# Patient Record
Sex: Female | Born: 1955 | Race: White | Hispanic: No | Marital: Married | Smoking: Current every day smoker
Health system: Southern US, Community
[De-identification: ages and names within clinical notes are randomized; demographics above are authoritative.]

## PROBLEM LIST (undated history)

## (undated) DIAGNOSIS — F32A Depression, unspecified: Secondary | ICD-10-CM

## (undated) DIAGNOSIS — K219 Gastro-esophageal reflux disease without esophagitis: Secondary | ICD-10-CM

## (undated) DIAGNOSIS — F419 Anxiety disorder, unspecified: Secondary | ICD-10-CM

## (undated) DIAGNOSIS — C801 Malignant (primary) neoplasm, unspecified: Secondary | ICD-10-CM

## (undated) DIAGNOSIS — F329 Major depressive disorder, single episode, unspecified: Secondary | ICD-10-CM

## (undated) DIAGNOSIS — E785 Hyperlipidemia, unspecified: Secondary | ICD-10-CM

## (undated) HISTORY — DX: Anxiety disorder, unspecified: F41.9

## (undated) HISTORY — DX: Gastro-esophageal reflux disease without esophagitis: K21.9

## (undated) HISTORY — PX: APPENDECTOMY: SHX54

## (undated) HISTORY — DX: Malignant (primary) neoplasm, unspecified: C80.1

## (undated) HISTORY — PX: ABDOMINAL HYSTERECTOMY: SHX81

## (undated) HISTORY — PX: POLYPECTOMY: SHX149

## (undated) HISTORY — DX: Depression, unspecified: F32.A

## (undated) HISTORY — PX: BACK SURGERY: SHX140

## (undated) HISTORY — DX: Major depressive disorder, single episode, unspecified: F32.9

## (undated) HISTORY — DX: Hyperlipidemia, unspecified: E78.5

## (undated) HISTORY — PX: COLONOSCOPY: SHX174

---

## 2000-07-01 ENCOUNTER — Encounter: Admission: RE | Admit: 2000-07-01 | Discharge: 2000-07-01 | Payer: Self-pay | Admitting: Obstetrics and Gynecology

## 2000-07-01 ENCOUNTER — Encounter: Payer: Self-pay | Admitting: Obstetrics and Gynecology

## 2001-11-24 ENCOUNTER — Encounter: Admission: RE | Admit: 2001-11-24 | Discharge: 2001-11-24 | Payer: Self-pay | Admitting: Obstetrics and Gynecology

## 2001-11-24 ENCOUNTER — Encounter: Payer: Self-pay | Admitting: Obstetrics and Gynecology

## 2003-01-02 ENCOUNTER — Observation Stay (HOSPITAL_COMMUNITY): Admission: AD | Admit: 2003-01-02 | Discharge: 2003-01-03 | Payer: Self-pay | Admitting: Physical Therapy

## 2003-01-02 ENCOUNTER — Encounter: Payer: Self-pay | Admitting: Internal Medicine

## 2003-01-03 ENCOUNTER — Encounter (INDEPENDENT_AMBULATORY_CARE_PROVIDER_SITE_OTHER): Payer: Self-pay | Admitting: Specialist

## 2003-01-03 ENCOUNTER — Encounter: Payer: Self-pay | Admitting: Obstetrics and Gynecology

## 2003-01-03 ENCOUNTER — Encounter (INDEPENDENT_AMBULATORY_CARE_PROVIDER_SITE_OTHER): Payer: Self-pay

## 2003-05-02 ENCOUNTER — Other Ambulatory Visit: Admission: RE | Admit: 2003-05-02 | Discharge: 2003-05-02 | Payer: Self-pay | Admitting: Obstetrics and Gynecology

## 2003-08-09 ENCOUNTER — Ambulatory Visit (HOSPITAL_COMMUNITY): Admission: RE | Admit: 2003-08-09 | Discharge: 2003-08-09 | Payer: Self-pay | Admitting: Neurosurgery

## 2004-06-03 ENCOUNTER — Other Ambulatory Visit: Admission: RE | Admit: 2004-06-03 | Discharge: 2004-06-03 | Payer: Self-pay | Admitting: Obstetrics and Gynecology

## 2005-02-26 ENCOUNTER — Ambulatory Visit: Payer: Self-pay | Admitting: Psychiatry

## 2005-02-26 ENCOUNTER — Emergency Department (HOSPITAL_COMMUNITY): Admission: EM | Admit: 2005-02-26 | Discharge: 2005-02-26 | Payer: Self-pay | Admitting: Emergency Medicine

## 2005-02-26 ENCOUNTER — Inpatient Hospital Stay (HOSPITAL_COMMUNITY): Admission: RE | Admit: 2005-02-26 | Discharge: 2005-03-02 | Payer: Self-pay | Admitting: Psychiatry

## 2005-03-09 ENCOUNTER — Ambulatory Visit (HOSPITAL_COMMUNITY): Payer: Self-pay | Admitting: Psychiatry

## 2005-03-23 ENCOUNTER — Ambulatory Visit (HOSPITAL_COMMUNITY): Payer: Self-pay | Admitting: Psychiatry

## 2005-04-20 ENCOUNTER — Ambulatory Visit (HOSPITAL_COMMUNITY): Payer: Self-pay | Admitting: Psychiatry

## 2005-05-22 ENCOUNTER — Ambulatory Visit (HOSPITAL_COMMUNITY): Admission: RE | Admit: 2005-05-22 | Discharge: 2005-05-22 | Payer: Self-pay | Admitting: Neurosurgery

## 2005-07-10 ENCOUNTER — Ambulatory Visit (HOSPITAL_COMMUNITY): Admission: RE | Admit: 2005-07-10 | Discharge: 2005-07-11 | Payer: Self-pay | Admitting: Neurosurgery

## 2005-08-19 ENCOUNTER — Ambulatory Visit (HOSPITAL_COMMUNITY): Payer: Self-pay | Admitting: Psychiatry

## 2005-08-20 ENCOUNTER — Other Ambulatory Visit: Admission: RE | Admit: 2005-08-20 | Discharge: 2005-08-20 | Payer: Self-pay | Admitting: Obstetrics and Gynecology

## 2005-12-16 ENCOUNTER — Ambulatory Visit (HOSPITAL_COMMUNITY): Payer: Self-pay | Admitting: Psychiatry

## 2006-01-14 ENCOUNTER — Ambulatory Visit (HOSPITAL_COMMUNITY): Payer: Self-pay | Admitting: Psychiatry

## 2006-02-22 ENCOUNTER — Ambulatory Visit (HOSPITAL_COMMUNITY): Payer: Self-pay | Admitting: Licensed Clinical Social Worker

## 2006-02-23 ENCOUNTER — Ambulatory Visit (HOSPITAL_COMMUNITY): Payer: Self-pay | Admitting: Psychiatry

## 2006-03-09 ENCOUNTER — Ambulatory Visit (HOSPITAL_COMMUNITY): Payer: Self-pay | Admitting: Licensed Clinical Social Worker

## 2006-03-15 ENCOUNTER — Ambulatory Visit (HOSPITAL_COMMUNITY): Payer: Self-pay | Admitting: Licensed Clinical Social Worker

## 2006-03-22 ENCOUNTER — Ambulatory Visit (HOSPITAL_COMMUNITY): Payer: Self-pay | Admitting: Licensed Clinical Social Worker

## 2006-03-29 ENCOUNTER — Ambulatory Visit (HOSPITAL_COMMUNITY): Payer: Self-pay | Admitting: Licensed Clinical Social Worker

## 2006-03-30 ENCOUNTER — Ambulatory Visit (HOSPITAL_COMMUNITY): Payer: Self-pay | Admitting: Psychiatry

## 2006-04-07 ENCOUNTER — Ambulatory Visit (HOSPITAL_COMMUNITY): Payer: Self-pay | Admitting: Licensed Clinical Social Worker

## 2006-04-27 ENCOUNTER — Ambulatory Visit (HOSPITAL_COMMUNITY): Payer: Self-pay | Admitting: Psychiatry

## 2006-06-22 ENCOUNTER — Ambulatory Visit (HOSPITAL_COMMUNITY): Payer: Self-pay | Admitting: Psychiatry

## 2006-09-28 ENCOUNTER — Ambulatory Visit (HOSPITAL_COMMUNITY): Payer: Self-pay | Admitting: Psychiatry

## 2006-12-03 ENCOUNTER — Encounter: Admission: RE | Admit: 2006-12-03 | Discharge: 2006-12-03 | Payer: Self-pay | Admitting: Obstetrics and Gynecology

## 2006-12-19 ENCOUNTER — Encounter: Admission: RE | Admit: 2006-12-19 | Discharge: 2006-12-19 | Payer: Self-pay | Admitting: Physical Therapy

## 2007-09-06 ENCOUNTER — Encounter: Admission: RE | Admit: 2007-09-06 | Discharge: 2007-09-06 | Payer: Self-pay | Admitting: Neurosurgery

## 2008-09-12 ENCOUNTER — Ambulatory Visit (HOSPITAL_COMMUNITY): Payer: Self-pay | Admitting: Psychiatry

## 2010-04-15 ENCOUNTER — Ambulatory Visit (HOSPITAL_COMMUNITY): Payer: Self-pay | Admitting: Psychiatry

## 2010-11-21 ENCOUNTER — Encounter (HOSPITAL_COMMUNITY): Payer: Self-pay | Admitting: Physician Assistant

## 2010-12-19 NOTE — H&P (Signed)
NAME:  BUENA, BOEHM                            ACCOUNT NO.:  [id number]   MEDICAL RECORD NO.:  [id number]                   PATIENT TYPE:  OBV   LOCATION:  9324                                 FACILITY:  [facility]   PHYSICIAN:  Dineen Kid. Rana Snare, M.D.                 DATE OF BIRTH:  [birth year]   DATE OF ADMISSION:  01/02/2003  DATE OF DISCHARGE:                                HISTORY & PHYSICAL   HISTORY OF PRESENT ILLNESS:  Ms. Pratt is a 55 year old, G1, P0, A1 who  presented to Dr. Merla Riches today with subacute onset of left lower quadrant  pain. Began two to three days ago when she woke in the morning and went to  church. Got somewhat better later that day, but progressively it has gotten  worse. She did have some relief after having a bowel movement yesterday, but  today because it continued to get worse, she presented to urgent care for  further evaluation. She denies any fevers, chills, nausea, vomiting. Her  past history is significant for history of carcinoma in situ of the cervix  where she had a radical hysterectomy at age 72, but both ovaries and tubes  were left at that time. Dr. Merla Riches examined her and thought she had an  acute abdomen with diverticulitis, sent her over to [facility] for a CAT  scan with contrast. The report of the CT was that she has a left complex  mass measuring 6.1 x 5.8 cm in the left adnexa with the differential  including tubo-ovarian abscess, cystic ovarian neoplasm, ovarian cyst with  possible hemorrhage or infection. The patient has remained afebrile, but  rates her pain on a scale of 10 at 5/10 but gets great relief from one half  of the Darvocet. She had an elevated white count of 15.4 today with a  hemoglobin of 12.6. Also had a negative chest x-ray and again has remained  afebrile.   PAST MEDICAL HISTORY:  Significant for general anxiety disorder on Prozac  and reflux disease. History of diverticulitis in the past.   PAST SURGICAL HISTORY:  1. She had a hysterectomy for carcinoma in situ in 1988 removing lymph nodes     and the uterus.  2. She had a lumbar back surgery in 1998.  3. Appendectomy in 1967.   GYNECOLOGICAL HISTORY:  She has had her GYN care by Dr. ________ in [facility]. Desires evaluation here. Pap smears have been normal. She also had a  normal SCC which has been drawn for eight years after her hysterectomy which  has remained normal. She also had a CA125 last year.   MEDICATIONS:  1. Prozac 20 mg a day.  2. Pepcid A.C. as needed.  3. Tylenol P.M. as needed.  4. Multivitamin q.d.  5. Darvocet one half tablet as needed.   SOCIAL HISTORY:  She is a half pack  per day smoker and social drinker.   PHYSICAL EXAMINATION:  VITAL SIGNS:  Her blood pressure is 115/68,  respirations 18, pulse 72, temperature 97.7.  GENERAL:  We have a well-nourished white female in no apparent distress,  speaking comfortably in a normal voice.  HEART:  Regular rate and rhythm.  LUNGS:  Clear to auscultation bilaterally.  ABDOMEN:  Bilateral upper quadrants are soft and nontender, is nondistended.  She does have some guarding in bilateral lower quadrants, left greater than  the right. No appreciable rebound is noted. Normal active bowel sounds.  PELVIC:  Exam was deferred. No flank pain noted.   IMPRESSION AND PLAN:  Left lower quadrant pain, left adnexal complex mass,  most likely represents ovarian cyst which is being complicated by hemorrhage  and possible infection due to the mildly elevated white count and increased  abdominal tenderness. Go ahead and place her on cefotetan 1 g q.12h. Keep  her on OP status with IV fluids. She has tolerated a regular diet tonight  without difficulty. Give her pain relief with Darvocet which is very  successful with low dose of Darvocet. Plan to repeat the CBC in the morning.  Will also repeat the CA-125. Will try to request records from Dr. ________  regarding the previous surgery and  cervical cancer and labs for that, and we  perform an ultrasound tomorrow morning of the pelvis for further evaluation.  Discussed possible options including laparoscopy or laparotomy. If the  patient continues do as well as she is today and the ultrasound has benign  appearance, we will discharge her home with follow up as an outpatient.                                               Dineen Kid Rana Snare, M.D.    DCL/MEDQ  D:  01/03/2003  T:  01/03/2003  Job:  [id number]

## 2010-12-19 NOTE — Op Note (Signed)
NAME:  Kayla [name], Kayla [name]                            ACCOUNT NO.:  [id number]   MEDICAL RECORD NO.:  [id number]                   PATIENT TYPE:  OBV   LOCATION:  9324                                 FACILITY:  [facility]   PHYSICIAN:  Dineen Kid. Rana Snare, M.D.                 DATE OF BIRTH:  [birth year]   DATE OF PROCEDURE:  01/03/2003  DATE OF DISCHARGE:                                 OPERATIVE REPORT   PREOPERATIVE DIAGNOSIS:  Left lower quadrant pain and left complex mass.   POSTOPERATIVE DIAGNOSIS:  Left lower quadrant pain and left complex mass,  hemorrhagic corpus luteum cyst and serous cyst and extensive pelvic and  abdominal adhesions.   PROCEDURE:  Laparoscopy with extensive lysis of adhesions and left ovarian  cystectomy.   SURGEON:  Dineen Kid. Rana Snare, M.D.   ANESTHESIA:  General endotracheal.   ESTIMATED BLOOD LOSS:  Less than 25 cc.   INDICATIONS FOR PROCEDURE:  Ms. Minner is a 78- year-old G1,P0 who presented  to the emergency room last night with left lower quadrant pain worsening  over the last 3-4 days, elevated white count, some rebound and guarding.  CT  scan and ultrasound showed a left ovarian complex mass.  The patient has a  remote history of radical hysterectomy for cervical cancer.  Because of  persistent pain not improving with conservative management and a complex  mass of the left adnexa with concern about torsion or ovarian neoplasm,  patient desires definitive surgical evaluation and treatment.  Plan  laparoscopy for evaluation, possible ovarian cystectomy, possible left  salpingo-oophorectomy, possible bilateral salpingo-oophorectomy and/or  indicated procedures.  The patient does give her Informed Consent.  The  risks and benefits were discussed at length which include, but are not  limited to, risk of infection, bleeding, damage to bowel, bladder, ureters,  risks associated with blood transfusion if there is hemorrhage, risk of  recurrence of pain or need for further  surgery, or risk of needing to open  her for further evaluation and treatment.  She does give her Informed  Consent.   OPERATIVE FINDINGS:  Extensive anterior abdominal wall adhesions from the  omentum and bowel.  Extensive pelvic adhesions in the left adnexa with the  ovary, fallopian tube, underneath the ovary, the bladder on top of the left  ovarian complex, and bowel and omentum over that as well.  A large serous  appearing cyst with an underlying cyst, what appeared to be hemorrhagic  corpus luteum in nature.  The right ovary and tube appear to be grossly  normal in appearance however adhered to the pelvic sidewall and to bowel.   DESCRIPTION OF PROCEDURE:  After adequate analgesia, the patient was placed  in the dorsal lithotomy position.  She was sterilely prepped and draped.  The bladder was sterilely drained.  A sponge stick was placed in the vagina.  A 1 cm  infraumbilical skin incision was made, a Veress needle was inserted.  The abdomen was insufflated with dullness to percussion.  A trocar was  inserted and was thought to be extraperitoneal due to the findings with the  laparoscope.  It was reinserted and finally peritoneum was grasped and  delivered the trocar through the peritoneum.  It was then realized that  because of the extensive adhesions it was not extraperitoneal but actually  adhesions around the incision site that we unable to see through.  After  examination of that area of insertion and no evidence of trauma to bowel or  bleeding, small holes were made through areas of adhesions so that the  laparoscope could be inserted.  This was performed until the laparoscope was  into the pelvis where the above findings were noted.  The confirmation of  the lysis of adhesions through the bowel and assurance that there was no  injury to the bowel or bleeding was performed by putting a 5 mm trocar into  the right lower quadrant two finger breadths above the pubic symphysis  under  direct visualization and  a 5 mm camera was inserted and the examination was  carried out with this.  At this point the regular laparoscope was  reinserted.  The ovarian cyst was grasped and cauterized with bipolar  cautery.  A large amount of serous fluid was relieved from a large cystic  area.  The cyst wall was sharply excised and sent to Pathology.  Underneath  this cystic area below it was opened had several centimeters worth of clot  consistent with a hemorrhagic corpus luteum.  This was also sent to  Pathology.  The majority of the cyst wall and ovary that we identified and  carefully removed was cauterized and removed.  Because of extensive  adhesions to the bowel, bladder and pelvic sidewall, it was felt that at  this time laparoscopically no further removal of the ovary could be safely  carried out.  Similarly with the right tube and ovary because they appeared  to be benign in appearance and densely adherent to the bowel, it was felt  that it would be best to leave them at this time.  If future removal of this  was necessary due to ongoing pain or recurrence of this, then we felt that a  laparotomy with general surgeon availability for bowel and possibly a  urologist for identification of the ureters would be necessary at the time.  So after careful dissection of the ovarian cyst out and sent to Pathology,  it returned as benign-appearing corpus luteal cyst and clot, hemostasis was  achieved with bipolar cautery.  A copious amount of irrigation was used and  at this point the trocars were removed and noted to be hemostatic and the  laparoscope was removed.  The skin incisions closed with a 0Vicryl, the  fascia a 3-0 Vicryl Rapide subcuticular stitch.  The 5 mm site was closed  with 3-0 Vicryl Rapide subcuticular stitch and incisions were injected with  0.25% Marcaine.  The sponge stick was removed from the vagina and the patient was transferred to the recovery room in  stable condition.   DISPOSITION:  The patient will be observed the remainder of this evening.  If she continues to do well as I would expect then we will discharge her  home with follow up in two to three weeks.  She received 1 gm of Cefotetan  preoperatively.  Dineen Kid Rana Snare, M.D.    DCL/MEDQ  D:  01/03/2003  T:  01/03/2003  Job:  [id number]

## 2010-12-19 NOTE — Op Note (Signed)
NAME:  Kayla [name], Kayla [name]                            ACCOUNT NO.:  [id number]   MEDICAL RECORD NO.:  [id number]                   PATIENT TYPE:  OIB   LOCATION:  3172                                 FACILITY:  [facility]   PHYSICIAN:  Danae Orleans. Venetia Maxon, M.D.               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   OPERATION PERFORMED:  Left L4-5 microdiskectomy and microdissection.   SURGEON:  Danae Orleans. Venetia Maxon, M.D.   ASSISTANT:  Clydene Fake, M.D.   ANESTHESIA:  General endotracheal.   ESTIMATED BLOOD LOSS:  Minimal.   COMPLICATIONS:  None.   DISPOSITION:  Recovery.   INDICATIONS FOR PROCEDURE:  [name] is a 56 year old woman with a  herniated lumbar disk at L4-5 on the left with significant left L5  radiculopathy.  It was elected to take her to surgery for microdiskectomy.   DESCRIPTION OF PROCEDURE:  [name] was brought to the operating room.  Following the satisfactory and uncomplicated induction of general  endotracheal anesthesia and placement of intravenous lines, the patient was  placed in a prone position on the Wilson frame.  The low back was then  prepped and draped in the usual sterile fashion.  The area of planned  incision was infiltrated with 0.25% Marcaine, 0.5% lidocaine, 1:200,000  epinephrine.  Incision was made in the midline and carried through adipose  tissue to the lumbodorsal fascia which was incised on the left side of  midline.  Subperiosteal dissection was then performed exposing the L4-5  interspace and intraoperative x-ray confirmed correct level.  A self-  retaining retractor was placed to facilitate  exposure.  Hemisemilaminectomy  of L4 was then performed as well as the foraminotomy overlying the superior  aspect of L5 with a high speed drill and Kerrison rongeurs.  The ligamentum  flavum was removed and detached in a piecemeal fashion and the lateral  recess was decompressed.  Microscope was brought into the field and using  microdissection technique, the L5 nerve root was mobilized medially exposing  the large herniated disk thinly contained by annular fibers.  This was then  incised with a 15 blade and disk material was removed in piecemeal fashion.  Medial and lateral aspect of the interspace was decompressed of residual  disk material.  Redundant annular edges were removed with osteophyte tool.  Hemostasis was assured.  The wound was copiously irrigated with bacitracin  saline.  A coronary dilator was easily passed along the floor of the canal  and there did not appear to be any obstruction of the nerve roots or thecal  sac either on L4 or 5 nerve roots.  The self-retaining retractor was  removed.  The microscope was taken out of the field.  The lumbodorsal fascia  was closed with 0 Vicryl suture.  The subcutaneous tissue were  reapproximated with 2-0 Vicryl interrupted inverted sutures and the skin  edges were reapproximated with interrupted 3-0 Vicryl subcuticular stitch.  The wound was dressed with Dermabond.  The patient was extubated in the  operating room and taken to the recovery room in stable and satisfactory  condition having tolerated the operation well.  Counts were correct at the  end of the case.                                               Danae Orleans. Venetia Maxon, M.D.    JDS/MEDQ  D:  08/09/2003  T:  08/09/2003  Job:  [id number]

## 2010-12-19 NOTE — Discharge Summary (Signed)
NAMEAMIRA, PODOLAK NO.:  [id number]   MEDICAL RECORD NO.:  [id number]          PATIENT TYPE:  IPS   LOCATION:  0302                          FACILITY:  [facility]   PHYSICIAN:  Anselm Jungling, MD  DATE OF BIRTH:  08-09-1955   DATE OF ADMISSION:  02/26/2005  DATE OF DISCHARGE:  03/02/2005                                 DISCHARGE SUMMARY   IDENTIFYING DATA AND REASON FOR ADMISSION:  This was the first [facility] admission  and first inpatient psychiatric hospitalization ever for Swayzie, a 55-year-  old married Caucasian female, who was admitted on a voluntary basis due to  increasing depression, anxiety and symptoms of panic disorder.   HISTORY OF PRESENTING PROBLEMS:  The patient had had a history of taking  Prozac for PMS.  She reported a 47-month history of increasing depression,  crying spells, anhedonia, neglect of self-care, decreased appetite, and  panic symptoms.  She was overwhelmed by responsibilities involving caring  for an 56 year old relative, and much guilt over her frustration as a  caregiver.  Please refer to the admission note for further details  pertaining to the symptoms and circumstances that lead to her  hospitalization at [facility].  She had also been seeing Valinda Hoar, on 2  occasions.  She came to [name] with no prior history of suicide attempt, mania  or psychosis.   DIAGNOSTIC IMPRESSION UPON ADMISSION:  AXIS I:  Major depressive disorder,  recurrent, severe.  Rule out panic disorder.  AXIS II:  Deferred.  AXIS III:  History of degenerative disk disease.  AXIS IV:  Stressors severe.  AXIS V:  Global assessment of function 25-35.   MEDICAL AND LABORATORY:  There were no significant medical issues during  this brief inpatient psychiatric stay.  The patient was continued on her  usual Gynodiol.   MEDICATIONS ON ADMISSION:  1.  Xanax 0.25 to 0.5 mg b.i.d.  2.  Prozac 40 mg daily.  3.  Gynodiol.   HOSPITAL COURSE:  The patient was admitted to the  adult inpatient service  where she participated in various therapeutic groups, activities and classes  designed to help her acquire better coping skills, a better understanding of  her underlying disorders and dynamics, and the development of an aftercare  plan.  She initially presented as a very anxious, distraught and frightened  individual who was very cooperative but very uncertain about her  hospitalization.  She was placed on her usual medication regimen including  Prozac.  She was also initially ordered her usual Xanax in doses of 2 mg  q.a.m., 2 mg at 3:00 p.m. and 2 mg at 8:00 p.m.  Risperdal 0.5 mg M-tab was  initiated at h.s. to assist with sleep.  Ambien 12.5 mg p.o. q.h.s. was also  available on a p.r.n. basis.   In hopes of a better antidepressant response and antianxiety response,  Prozac was discontinued in favor of a trial of Zoloft.  She was initially  given 25 mg, which when well tolerated, was increased to 50 mg daily.  Xanax  was discontinued in  favor of a trial of Klonopin.  It was felt that her  Xanax dosage was somewhat high, and although she might need some anxiolytic  medication to address strong symptoms of panic, it was felt in the patient's  best interest to use the minimal amount possible.  Klonopin 0.5 mg was  ordered for a 9:00 p.m. daily scheduled dose.  Klonopin 0.5 mg p.o. b.i.d.  p.r.n. was also available.   The patient responded well to these various medication changes.  Sleep and  appetite improved, and her level of general anxiety diminished significantly  over her 5-day hospital stay.   Prior to discharge, the patient and her husband had a family meeting  together with a program therapist.  It was a very productive meeting with  the couple being able to identify and agree to a need to spending more time  with one another and opening up their communication further.   Following the family meeting, the undersigned met with patient and her  husband  and we discussed discharge.   AFTERCARE:  The patient was discharged on March 02, 2005 in much better  spirits.  She was discharged on Zoloft 50 mg p.o. daily, Risperdal 0.5 mg at  9:00 p.m., Ambien 12.5 mg p.o. at 10:00 p.m., and Klonopin 0.5 mg p.o. at  9:00 p.m.  She was to continue her usual home medications including Zelnorm  and estradiol.   The patient was to follow up with the undersigned on March 09, 2005 at 1:00  p.m. in the outpatient clinic.  She was to meet with therapist, Fabio Pierce, on March 06, 2005 at 4:00 p.m.  She was issued 30-day  prescriptions for all of the above.   DISCHARGE DIAGNOSES:  AXIS I:  Panic disorder without agoraphobia.  Depressive disorder, not otherwise specified.  AXIS II:  Deferred.  AXIS III:  Degenerative disk disease.  AXIS IV:  Stressors severe.  AXIS V:  Global assessment of function on discharge 70.     _______________    SPB/MEDQ  D:  03/09/2005  T:  03/09/2005  Job:  [id number]

## 2010-12-19 NOTE — Op Note (Signed)
NAMEARAYNA, ILLESCAS                  ACCOUNT NO.:  [id number]   MEDICAL RECORD NO.:  [id number]            PATIENT TYPE:   LOCATION:                                 FACILITY:   PHYSICIAN:  Danae Orleans. Venetia Maxon, M.D.       DATE OF BIRTH:   DATE OF PROCEDURE:  07/10/2005  DATE OF DISCHARGE:                                 OPERATIVE REPORT   PREOPERATIVE DIAGNOSES:  Recurrent disk herniation, L4-5 left, with  spondylosis, degenerative disk disease and radiculopathy.   POSTOPERATIVE DIAGNOSES:  Recurrent disk herniation, L4-5 left, with  spondylosis, degenerative disk disease and radiculopathy.   PROCEDURE:  Re-do left L4-5 micro-diskectomy with micro-dissection.   SURGEON:  Danae Orleans. Venetia Maxon, M.D.   ASSISTANT:  Clydene Fake, M.D.   ANESTHESIA:  General endotracheal anesthesia.   ESTIMATED BLOOD LOSS:  Minimal.   COMPLICATIONS:  None.   DISPOSITION:  To the recovery room.   INDICATIONS FOR PROCEDURE:  Ms. Modean Mccullum is a 55 year old woman who had  previously undergone a left L4-5 micro-diskectomy and previous to that had  undergone a right L5-S1 micro-diskectomy.  She did well until fairly  recently, when she developed a recurrent left leg pain and was found to have  a recurrent disk herniation at L4-5 on the left.  She has significant L5  radiculopathy.  It was elected to take her to surgery for a re-do micro-  diskectomy at L4-5, left.   DESCRIPTION OF PROCEDURE:  Ms. Polgar was brought to the operating room.  Following the satisfactory and uncomplicated induction of general  endotracheal anesthesia and placement of intravenous lines, the patient was  placed in the prone position on the Wilson frame.  Her low back was then  prepped and draped in the usual sterile fashion.  The area of planned  incision was infiltrated with 0.25% Marcaine and 0.5% lidocaine with  1:200,000 epinephrine.  Her previous incision was reopened and carried to  the lumbodorsal fascia which was incised to  the left side of midline.  A  subperiosteal dissection was performed, exposing the L4-5 interspace.  After  intraoperative x-ray confirmed the correct orientation of the L4-5 level, a  small amount of bone was removed along the L4 lamina and lateral aspect of  the spinal canal, and a foraminotomy was performed overlying the L5 nerve  root.  The microscope was brought into the field, and using micro-dissection  technique the lateral aspect of the thecal sac and L5 nerve root were  mobilized medially, exposing a scarred-in fairly large disk herniation.  Multiple fragments of disk material were removed with resultant significant  decompression of the thecal sac and L5 nerve root.  The interspace was then  further cleared of residual disk material, along with the medial and lateral  aspects of the interspace.  The nerve root was felt to be well-decompressed,  as was the thecal sac.  There was no evidence of any CSF leak.  The wound  was copiously irrigated with Bacitracin and saline.  Then 80 mg of Depo-  Medrol in  2 mL of fentanyl were placed in the operative bed.  The microscope  was taken out of the field.  The long dorsal fascia was closed with #0  Vicryl sutures.  The subcutaneous tissues were reapproximated with #2-0  Vicryl interrupted inverted sutures.  The skin edges were reapproximated  with interrupted #3-0 Vicryl subcuticular stitch.  The wound was dressed  with Dermabond.   The patient was extubated in the operating room and taken to the recovery  room in stable and satisfactory condition, having tolerated the operation  well.  The counts were correct at the end of the case.      Danae Orleans. Venetia Maxon, M.D.  Electronically Signed     JDS/MEDQ  D:  07/10/2005  T:  07/10/2005  Job:  [id number]

## 2010-12-23 ENCOUNTER — Encounter (HOSPITAL_COMMUNITY): Payer: Commercial Managed Care - PPO | Admitting: Physician Assistant

## 2010-12-23 DIAGNOSIS — F410 Panic disorder [episodic paroxysmal anxiety] without agoraphobia: Secondary | ICD-10-CM

## 2011-06-17 ENCOUNTER — Other Ambulatory Visit (HOSPITAL_COMMUNITY): Payer: Self-pay | Admitting: Physician Assistant

## 2011-06-17 DIAGNOSIS — F332 Major depressive disorder, recurrent severe without psychotic features: Secondary | ICD-10-CM

## 2011-06-17 MED ORDER — ZOLPIDEM TARTRATE ER 12.5 MG PO TBCR
12.5000 mg | EXTENDED_RELEASE_TABLET | Freq: Every day | ORAL | Status: DC
Start: 2011-06-17 — End: 2011-08-18

## 2011-06-22 ENCOUNTER — Other Ambulatory Visit (HOSPITAL_COMMUNITY): Payer: Self-pay

## 2011-06-23 ENCOUNTER — Ambulatory Visit (INDEPENDENT_AMBULATORY_CARE_PROVIDER_SITE_OTHER): Payer: Commercial Managed Care - PPO | Admitting: Physician Assistant

## 2011-06-23 DIAGNOSIS — F331 Major depressive disorder, recurrent, moderate: Secondary | ICD-10-CM

## 2011-06-23 NOTE — Progress Notes (Signed)
   [facility] Follow-up Outpatient Visit  Kayla [name] Kayla 11, 1957  Date: 06/23/11    Subjective: Pt reports she continues to do well.  States her mood is stable.  Sleep and appetite are good.  Denies SI/HI or AVH.  Having some difficulty at work with a co-worker who is rude to her.    There were no vitals filed for this visit.  Mental Status Examination  Appearance: Neat  Alert: Yes Attention: good  Cooperative: Yes Eye Contact: Good Speech: clear and even  Psychomotor Activity: Normal Memory/Concentration: WNL  Oriented: person, place, time/date and situation Mood: Euthymic Affect: Congruent Thought Processes and Associations: Linear Fund of Knowledge: Good Thought Content:  Insight: Good Judgement: Good  Diagnosis: Major depressive disorder, recurrent, moderate  Treatment Plan: Continue current medications and Follow up in six months  Shavon Zenz, PA

## 2011-08-05 ENCOUNTER — Other Ambulatory Visit (HOSPITAL_COMMUNITY): Payer: Self-pay | Admitting: Physician Assistant

## 2011-08-05 DIAGNOSIS — F332 Major depressive disorder, recurrent severe without psychotic features: Secondary | ICD-10-CM

## 2011-08-18 ENCOUNTER — Other Ambulatory Visit (HOSPITAL_COMMUNITY): Payer: Self-pay | Admitting: *Deleted

## 2011-08-18 DIAGNOSIS — F332 Major depressive disorder, recurrent severe without psychotic features: Secondary | ICD-10-CM

## 2011-08-18 MED ORDER — ZOLPIDEM TARTRATE ER 12.5 MG PO TBCR: 12.5000 mg | EXTENDED_RELEASE_TABLET | Freq: Every day | ORAL | Status: DC

## 2011-08-21 ENCOUNTER — Other Ambulatory Visit (HOSPITAL_COMMUNITY): Payer: Self-pay | Admitting: Physician Assistant

## 2011-08-21 DIAGNOSIS — F331 Major depressive disorder, recurrent, moderate: Secondary | ICD-10-CM

## 2011-10-16 ENCOUNTER — Other Ambulatory Visit (HOSPITAL_COMMUNITY): Payer: Self-pay | Admitting: Psychology

## 2011-10-16 ENCOUNTER — Telehealth (HOSPITAL_COMMUNITY): Payer: Self-pay | Admitting: Psychology

## 2011-10-16 DIAGNOSIS — F332 Major depressive disorder, recurrent severe without psychotic features: Secondary | ICD-10-CM

## 2011-10-16 MED ORDER — ZOLPIDEM TARTRATE ER 12.5 MG PO TBCR: 12.5000 mg | EXTENDED_RELEASE_TABLET | Freq: Every day | ORAL | Status: DC

## 2011-10-20 NOTE — Telephone Encounter (Signed)
See telephone notes

## 2011-11-15 ENCOUNTER — Other Ambulatory Visit (HOSPITAL_COMMUNITY): Payer: Self-pay | Admitting: Physician Assistant

## 2011-11-15 DIAGNOSIS — F331 Major depressive disorder, recurrent, moderate: Secondary | ICD-10-CM

## 2011-12-15 ENCOUNTER — Other Ambulatory Visit (HOSPITAL_COMMUNITY): Payer: Self-pay | Admitting: *Deleted

## 2011-12-15 DIAGNOSIS — F332 Major depressive disorder, recurrent severe without psychotic features: Secondary | ICD-10-CM

## 2011-12-15 MED ORDER — ZOLPIDEM TARTRATE ER 12.5 MG PO TBCR: 12.5000 mg | EXTENDED_RELEASE_TABLET | Freq: Every day | ORAL | Status: DC

## 2011-12-22 ENCOUNTER — Ambulatory Visit (INDEPENDENT_AMBULATORY_CARE_PROVIDER_SITE_OTHER): Payer: Commercial Managed Care - PPO | Admitting: Physician Assistant

## 2011-12-22 DIAGNOSIS — F330 Major depressive disorder, recurrent, mild: Secondary | ICD-10-CM

## 2011-12-22 NOTE — Progress Notes (Signed)
   [facility] Follow-up Outpatient Visit  Kayla [name] 1955-12-01  Date: 12/22/2011   Subjective: Kayla [name] presents today to followup on medications prescribed for depression and anxiety. She reports that she is doing very well. 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 She is much happier now that she is no longer in an area where she has conflict with coworkers. She reports that she is sleeping well and eating well. She denies any suicidal or homicidal ideation. She denies any auditory or visual hallucinations.  There were no vitals filed for this visit.  Mental Status Examination  Appearance: Well groomed and neatly dressed Alert: Yes Attention: good  Cooperative: Yes Eye Contact: Good Speech: Clear and coherent Psychomotor Activity: Normal Memory/Concentration: Intact Oriented: person, place, time/date and situation Mood: Euthymic Affect: Appropriate Thought Processes and Associations: Linear Fund of Knowledge: Good Thought Content: Normal Insight: Good Judgement: Good  Diagnosis: Maj. depressive disorder recurrent mild  Treatment Plan: We will continue her Effexor XR 300 mg daily, Lamictal 100 mg daily, and Ambien CR 12.5 mg at bedtime. She will followup in 6 months.  Julien Berryman, PA-C

## 2012-01-19 ENCOUNTER — Other Ambulatory Visit (HOSPITAL_COMMUNITY): Payer: Self-pay | Admitting: *Deleted

## 2012-02-13 ENCOUNTER — Other Ambulatory Visit (HOSPITAL_COMMUNITY): Payer: Self-pay | Admitting: Physician Assistant

## 2012-02-15 ENCOUNTER — Other Ambulatory Visit (HOSPITAL_COMMUNITY): Payer: Self-pay | Admitting: *Deleted

## 2012-02-15 DIAGNOSIS — F332 Major depressive disorder, recurrent severe without psychotic features: Secondary | ICD-10-CM

## 2012-02-15 MED ORDER — ZOLPIDEM TARTRATE ER 12.5 MG PO TBCR: 12.5000 mg | EXTENDED_RELEASE_TABLET | Freq: Every day | ORAL | Status: DC

## 2012-03-21 ENCOUNTER — Other Ambulatory Visit: Payer: Self-pay | Admitting: Obstetrics and Gynecology

## 2012-04-12 ENCOUNTER — Other Ambulatory Visit (HOSPITAL_COMMUNITY): Payer: Self-pay | Admitting: *Deleted

## 2012-04-12 DIAGNOSIS — F332 Major depressive disorder, recurrent severe without psychotic features: Secondary | ICD-10-CM

## 2012-04-12 MED ORDER — ZOLPIDEM TARTRATE ER 12.5 MG PO TBCR: 12.5000 mg | EXTENDED_RELEASE_TABLET | Freq: Every day | ORAL | Status: DC

## 2012-05-10 ENCOUNTER — Ambulatory Visit (INDEPENDENT_AMBULATORY_CARE_PROVIDER_SITE_OTHER): Payer: Commercial Managed Care - PPO | Admitting: Family Medicine

## 2012-05-10 ENCOUNTER — Encounter: Payer: Self-pay | Admitting: Family Medicine

## 2012-05-10 VITALS — BP 110/73 | HR 74 | Ht 66.0 in | Wt 125.0 lb

## 2012-05-10 DIAGNOSIS — M25519 Pain in unspecified shoulder: Secondary | ICD-10-CM

## 2012-05-10 DIAGNOSIS — M25512 Pain in left shoulder: Secondary | ICD-10-CM

## 2012-05-10 NOTE — Patient Instructions (Addendum)
You have rotator cuff impingement Try to avoid painful activities (overhead activities, lifting with extended arm) as much as possible. Aleve 2 tabs twice a day with food for pain and inflammation. Subacromial injection may be beneficial to help with pain and to decrease inflammation - you were given this today. Do home exercise program with theraband and scapular stabilization exercises daily - these are very important for long term relief even if an injection was given - 3 sets of 10 of each, start after 5-7 days. If not improving at follow-up we will consider further imaging, physical therapy, nitro patches. Follow up with me in 5-6 weeks.

## 2012-05-11 ENCOUNTER — Encounter: Payer: Self-pay | Admitting: Family Medicine

## 2012-05-11 DIAGNOSIS — M25512 Pain in left shoulder: Secondary | ICD-10-CM | POA: Insufficient documentation

## 2012-05-11 NOTE — Progress Notes (Signed)
  Subjective:    Patient ID: Kayla [name], female    DOB: 1956-03-12, 56 y.o.   MRN: [id number]  PCP: None listed  HPI 56 yo F here for left shoulder pain.  Patient denies known injury. States pain started around 10 months ago. No increase in activity around that time. Pain worse with overhead activities, reaching. Has worsened and become more frequent since then. No numbness, tingling. No neck pain. Has tried ibuprofen and heating pad. Is left handed. No prior issues like this.  Past Medical History  Diagnosis Date  . Anxiety   . Depression   . GERD (gastroesophageal reflux disease)     Current Outpatient Prescriptions on File Prior to Visit  Medication Sig Dispense Refill  . estrogen-methylTESTOSTERone (ESTRATEST) 1.25-2.5 MG per tablet       . lamoTRIgine (LAMICTAL) 100 MG tablet TAKE 1 TABLET EVERY DAY  30 tablet  3  . venlafaxine XR (EFFEXOR-XR) 150 MG 24 hr capsule TAKE 2 CAPSULES BY MOUTH DAILY  180 capsule  3  . zolpidem (AMBIEN CR) 12.5 MG CR tablet Take 1 tablet (12.5 mg total) by mouth at bedtime.  30 tablet  1    Past Surgical History  Procedure Date  . Appendectomy   . Abdominal hysterectomy   . Back surgery     No Known Allergies  History   Social History  . Marital Status: Married    Spouse Name: N/A    Number of Children: N/A  . Years of Education: N/A   Occupational History  . Not on file.   Social History Main Topics  . Smoking status: Never Smoker   . Smokeless tobacco: Not on file  . Alcohol Use: Not on file  . Drug Use: Not on file  . Sexually Active: Not on file   Other Topics Concern  . Not on file   Social History Narrative  . No narrative on file    No family history on file.  BP 110/73  Pulse 74  Ht 5\' 6"  (1.676 m)  Wt 125 lb (56.7 kg)  BMI 20.18 kg/m2  Review of Systems See HPI above.    Objective:   Physical Exam Gen: NAD  L shoulder: No swelling, ecchymoses.  No gross deformity. No TTP. FROM with painful  arc. Positive Hawkins, Neers. Negative Speeds, Yergasons. Strength 4+/5 with empty can, 5/5 with resisted internal/external rotation. Negative apprehension. NV intact distally.  R shoulder: FROM without pain or weakness.    Assessment & Plan:  1. Left shoulder rotator cuff impingement - Avoid painful activities.  Aleve twice a day with food.  Subacromial injection given.  Shown home exercise program and handouts provided.  F/u in 6 weeks for reevaluation - consider imaging, PT, nitro if not improving.  After informed written consent, patient was seated on exam table. Left shoulder was prepped with alcohol swab and utilizing posterior approach, patient's left subacromial space was injected with 3:1 marcaine: depomedrol. Patient tolerated the procedure well without immediate complications.

## 2012-05-11 NOTE — Assessment & Plan Note (Signed)
Left shoulder rotator cuff impingement - Avoid painful activities.  Aleve twice a day with food.  Subacromial injection given.  Shown home exercise program and handouts provided.  F/u in 6 weeks for reevaluation - consider imaging, PT, nitro if not improving.  After informed written consent, patient was seated on exam table. Left shoulder was prepped with alcohol swab and utilizing posterior approach, patient's left subacromial space was injected with 3:1 marcaine: depomedrol. Patient tolerated the procedure well without immediate complications.

## 2012-06-13 ENCOUNTER — Other Ambulatory Visit (HOSPITAL_COMMUNITY): Payer: Self-pay | Admitting: Physician Assistant

## 2012-06-23 ENCOUNTER — Ambulatory Visit (HOSPITAL_COMMUNITY): Payer: Self-pay | Admitting: Physician Assistant

## 2012-07-29 ENCOUNTER — Other Ambulatory Visit (HOSPITAL_COMMUNITY): Payer: Self-pay | Admitting: Physician Assistant

## 2012-08-02 ENCOUNTER — Ambulatory Visit (INDEPENDENT_AMBULATORY_CARE_PROVIDER_SITE_OTHER): Payer: Commercial Managed Care - PPO | Admitting: Physician Assistant

## 2012-08-02 DIAGNOSIS — F331 Major depressive disorder, recurrent, moderate: Secondary | ICD-10-CM

## 2012-08-02 DIAGNOSIS — F411 Generalized anxiety disorder: Secondary | ICD-10-CM

## 2012-08-02 MED ORDER — VENLAFAXINE HCL ER 150 MG PO CP24: 300.0000 mg | ORAL_CAPSULE | Freq: Every day | ORAL | Status: DC

## 2012-08-02 MED ORDER — LAMOTRIGINE 100 MG PO TABS: 100.0000 mg | ORAL_TABLET | Freq: Every day | ORAL | Status: DC

## 2012-08-02 NOTE — Progress Notes (Signed)
   [facility] Behavioral [facility] Follow-up Outpatient Visit  DASHAWN GOLDA 12-Jul-1956  Date: 08/02/2012   Subjective: Josepha presents today to followup on her treatment for depression and anxiety. 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 Otherwise she reports she is doing well. Her sleep and appetite are good. She denies any suicidal or homicidal ideation. She denies any auditory or visual hallucinations.  There were no vitals filed for this visit.  Mental Status Examination  Appearance: Well groomed and nicely dressed Alert: Yes Attention: good  Cooperative: Yes Eye Contact: Good Speech: Clear and coherent Psychomotor Activity: Normal Memory/Concentration: Intact Oriented: person, place, time/date and situation Mood: Euthymic Affect: Appropriate Thought Processes and Associations: Linear Fund of Knowledge: Good Thought Content: Normal Insight: Good Judgement: Good  Diagnosis: Maj. depressive disorder, recurrent, moderate; generalized anxiety disorder.  Treatment Plan: We will continue her Lamictal 100 mg daily, Effexor XR 300 mg daily, and [place] CR 12.5 mg at bedtime. As her insurance has changed we'll send her prescriptions to the med center at [facility]. She will return for followup in 6 months.  Lysbeth Dicola, PA-C

## 2012-11-16 ENCOUNTER — Other Ambulatory Visit (HOSPITAL_COMMUNITY): Payer: Self-pay | Admitting: Physician Assistant

## 2013-01-30 ENCOUNTER — Ambulatory Visit (HOSPITAL_COMMUNITY): Payer: Self-pay | Admitting: Physician Assistant

## 2013-02-28 ENCOUNTER — Encounter (HOSPITAL_COMMUNITY): Payer: Self-pay | Admitting: Physician Assistant

## 2013-02-28 ENCOUNTER — Ambulatory Visit (INDEPENDENT_AMBULATORY_CARE_PROVIDER_SITE_OTHER): Payer: Self-pay | Admitting: Physician Assistant

## 2013-02-28 VITALS — BP 124/67 | HR 82 | Ht 66.0 in | Wt 132.0 lb

## 2013-02-28 DIAGNOSIS — F331 Major depressive disorder, recurrent, moderate: Secondary | ICD-10-CM

## 2013-02-28 DIAGNOSIS — F411 Generalized anxiety disorder: Secondary | ICD-10-CM

## 2013-02-28 MED ORDER — MIRTAZAPINE 15 MG PO TABS: 15.0000 mg | ORAL_TABLET | Freq: Every day | ORAL | Status: DC

## 2013-02-28 NOTE — Progress Notes (Signed)
[facility] [id number] Progress Note  Kayla [name] [id number] 57 y.o.  02/28/2013 1:48 PM  Chief Complaint: Increased anxiety  History of Present Illness: Kayla [name] presents today to followup on her treatment for depression and anxiety. She reports that her anxiety has been increased recently after the death of her brother in 21-Sep-2022. She reports that although her brother was 77 years old, his death was unexpected. He died of a heart attack while lying on his couch. She reports that her mother has been extremely depressed, and Kayla [name] feels a need to care for her mother. She is feeling rather overwhelmed, and states that she is nervous all the time. This is affecting her sleep, and she often wakes at 3:30 or 4:00 in the morning and cannot go back to sleep. She denies that she is experiencing any depression. She continues to take the Ambien CR 12.5 mg, but she wakes suddenly in the middle of the night. She denies any suicidal or homicidal ideation. She denies any auditory or visual hallucinations.   Suicidal Ideation: No Plan Formed: No Patient has means to carry out plan: No  Homicidal Ideation: No Plan Formed: No Patient has means to carry out plan: No  Review of Systems: Psychiatric: Agitation: No Hallucination: No Depressed Mood: No Insomnia: Yes Hypersomnia: No Altered Concentration: No Feels Worthless: No Grandiose Ideas: No Belief In Special Powers: No New/Increased Substance Abuse: No Compulsions: No  Neurologic: Headache: No Seizure: No Paresthesias: No  Past Medical History: GERD  Outpatient Encounter Prescriptions as of 02/28/2013  Medication Sig Dispense Refill  . estrogen-methylTESTOSTERone (ESTRATEST) 1.25-2.5 MG per tablet       . lamoTRIgine (LAMICTAL) 100 MG tablet Take 1 tablet (100 mg total) by mouth daily.  90 tablet  3  . mirtazapine (REMERON) 15 MG tablet Take 1 tablet (15 mg total) by mouth at bedtime.  30 tablet  0  . venlafaxine XR (EFFEXOR-XR) 150  MG 24 hr capsule Take 2 capsules (300 mg total) by mouth daily.  180 capsule  3  . zolpidem (AMBIEN CR) 12.5 MG CR tablet TAKE 1 TABLET BY MOUTH AT BEDTIME AS NEEDED FOR SLEEP  30 tablet  5   No facility-administered encounter medications on file as of 02/28/2013.    Past Psychiatric History/Hospitalization(s): Anxiety: Yes Bipolar Disorder: No Depression: Yes Mania: No Psychosis: No Schizophrenia: No Personality Disorder: No Hospitalization for psychiatric illness: No History of Electroconvulsive Shock Therapy: No Prior Suicide Attempts: No  Physical Exam: Constitutional:  BP 124/67  Pulse 82  Ht 5\' 6"  (1.676 m)  Wt 132 lb (59.875 kg)  BMI 21.32 kg/m2  General Appearance: alert, oriented, no acute distress, well nourished and well groomed and dressed  Musculoskeletal: Strength & Muscle Tone: within normal limits Gait & Station: normal Patient leans: N/A  Psychiatric: Speech (describe rate, volume, coherence, spontaneity, and abnormalities if any): Clear and coherent with a rate rate and rhythm and normal volume  Thought Process (describe rate, content, abstract reasoning, and computation): Within normal limits  Associations: Intact  Thoughts: normal  Mental Status: Orientation: oriented to person, place, time/date and situation Mood & Affect: anxiety and congruent affect Attention Span & Concentration:  intact  Medical Decision Making (Choose Three): Review of Psycho-Social Stressors (1), Established Problem, Worsening (2), Review of Medication Regimen & Side Effects (2) and Review of New Medication or Change in Dosage (2)  Assessment: Axis I: Generalized anxiety disorder; major depressive disorder, recurrent, moderate  Axis II: Deferred  Axis III: GERD  Axis IV: Moderate to severe  Axis V: 60   Plan: We will add a Remeron 7.5-15 mg at bedtime to enhance her sleep. She has been given permission to try with and without the Ambien. We'll continue the Effexor  and Lamictal as previously prescribed. She will return for followup in 6 months. She is encouraged to call between appointments if there are concerns.  Barlow Harrison, PA-C 02/28/2013

## 2013-03-23 ENCOUNTER — Encounter: Payer: Self-pay | Admitting: Internal Medicine

## 2013-04-11 ENCOUNTER — Telehealth (HOSPITAL_COMMUNITY): Payer: Self-pay

## 2013-04-13 ENCOUNTER — Ambulatory Visit (HOSPITAL_COMMUNITY): Payer: Self-pay | Admitting: Physician Assistant

## 2013-05-15 ENCOUNTER — Other Ambulatory Visit (HOSPITAL_COMMUNITY): Payer: Self-pay | Admitting: *Deleted

## 2013-05-15 DIAGNOSIS — F331 Major depressive disorder, recurrent, moderate: Secondary | ICD-10-CM

## 2013-05-15 MED ORDER — ZOLPIDEM TARTRATE ER 12.5 MG PO TBCR: EXTENDED_RELEASE_TABLET | ORAL | Status: DC

## 2013-08-02 ENCOUNTER — Other Ambulatory Visit (HOSPITAL_COMMUNITY): Payer: Self-pay | Admitting: Physician Assistant

## 2013-08-02 DIAGNOSIS — F331 Major depressive disorder, recurrent, moderate: Secondary | ICD-10-CM

## 2013-08-02 NOTE — Telephone Encounter (Signed)
Chart reviewed Refill appropriate Appt with Dr. Arfeen 10/09/13 

## 2013-08-07 ENCOUNTER — Other Ambulatory Visit (HOSPITAL_COMMUNITY): Payer: Self-pay | Admitting: Physician Assistant

## 2013-08-07 DIAGNOSIS — F331 Major depressive disorder, recurrent, moderate: Secondary | ICD-10-CM

## 2013-08-09 ENCOUNTER — Other Ambulatory Visit (HOSPITAL_COMMUNITY): Payer: Self-pay | Admitting: *Deleted

## 2013-08-09 NOTE — Telephone Encounter (Signed)
Chart reviewed Refill appropriate Appt with Dr. Adele Schilder 10/09/13

## 2013-10-09 ENCOUNTER — Ambulatory Visit (INDEPENDENT_AMBULATORY_CARE_PROVIDER_SITE_OTHER): Payer: 59 | Admitting: Psychiatry

## 2013-10-09 ENCOUNTER — Encounter (HOSPITAL_COMMUNITY): Payer: Self-pay | Admitting: Psychiatry

## 2013-10-09 ENCOUNTER — Ambulatory Visit (HOSPITAL_COMMUNITY): Payer: Self-pay | Admitting: Physician Assistant

## 2013-10-09 DIAGNOSIS — F411 Generalized anxiety disorder: Secondary | ICD-10-CM

## 2013-10-09 DIAGNOSIS — F331 Major depressive disorder, recurrent, moderate: Secondary | ICD-10-CM

## 2013-10-09 MED ORDER — ZOLPIDEM TARTRATE ER 12.5 MG PO TBCR: EXTENDED_RELEASE_TABLET | ORAL | Status: DC

## 2013-10-09 NOTE — Progress Notes (Signed)
[place] [phone] Progress Note  Kayla [name] [id number] 58 y.o.  10/09/2013 2:32 PM  Chief Complaint:  I am feeling better.  I do not want to change any medication.  I am not taking Remeron because it was situational anxiety .    History of Present Illness:  Kayla [name] is 58 year old Caucasian employed female who has been seen in this office since 2007 after inpatient psychiatric services.  Patient was seen physician assistant who has left the practice.   She was admitted to behavioral [place] in 2007 because of severe depression anxiety and having panic attacks.  She was taking care of her mother .  Upon discharge she was recommended to take Risperdal and Zoloft but patient does not feel any improvement in her medications were changed.  She is taking Effexor , Ambien and Lamictal.  Patient denies any tremors or shakes.  She denies any rash.  She still feels anxious but does not want to change her medication.  On her last visit but the physician assistant in July 2014 she was recommended to try Remeron but patient does not drive for a long time.  Patient told at that time she was feeling very anxious because her brother was deceased .  She felt it was situational anxiety and she does not have panic attack.  Patient lives with her husband.  She has no children.  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  Patient gets sometimes overwhelmed, frustrated with her because her mother does not understand things very well.  Patient had a supportive husband.  She works at [employer] for more than 35 years.  Patient likes her job.  Patient denies any drinking or using any illegal substances.  She denies any recent panic attacks or any crying spells.  She denies any side effects of medication.  Her sleep is good.  She requires Ambien for insomnia.  Patient was to continue her current psychotropic medication.  Recently she had physical and blood work which she  believed normal.  The patient has a copy of the results which she will faxed to [name] that his son.  Patient denies any hallucinations, suicidal thoughts or any homicidal thoughts.  Her appetite and weight is unchanged from the past.  Suicidal Ideation: No Plan Formed: No Patient has means to carry out plan: No  Homicidal Ideation: No Plan Formed: No Patient has means to carry out plan: No  Review of Systems: Psychiatric: Agitation: No Hallucination: No Depressed Mood: No Insomnia: Yes Hypersomnia: No Altered Concentration: No Feels Worthless: No Grandiose Ideas: No Belief In Special Powers: No New/Increased Substance Abuse: No Compulsions: No  Neurologic: Headache: No Seizure: No Paresthesias: No  Past Medical History: GERD  Outpatient Encounter Prescriptions as of 10/09/2013  Medication Sig  . estrogen-methylTESTOSTERone (ESTRATEST) 1.25-2.5 MG per tablet   . lamoTRIgine (LAMICTAL) 100 MG tablet TAKE 1 TABLET BY MOUTH DAILY.  . rosuvastatin (CRESTOR) 5 MG tablet Take 5 mg by mouth daily.  Marland Kitchen venlafaxine XR (EFFEXOR-XR) 150 MG 24 hr capsule TAKE 2 CAPSULES BY MOUTH DAILY.  Marland Kitchen zolpidem (AMBIEN CR) 12.5 MG CR tablet TAKE 1 TABLET BY MOUTH AT BEDTIME AS NEEDED FOR SLEEP  . [DISCONTINUED] zolpidem (AMBIEN CR) 12.5 MG CR tablet TAKE 1 TABLET BY MOUTH AT BEDTIME AS NEEDED FOR SLEEP  . [DISCONTINUED] mirtazapine (REMERON) 15 MG tablet Take 1 tablet (15 mg total) by mouth at bedtime.    Past Psychiatric History/Hospitalization(s): Patient  has one psychiatric hospitalization in 2007 feeling depressed, panic attack and unable to work.  She was taking care of her elderly mother.  She denies any history of suicidal intent, mania or any psychosis.  Patient has tried in the past Zoloft, Remeron Xanax and Risperdal. Anxiety: Yes Bipolar Disorder: No Depression: Yes Mania: No Psychosis: No Schizophrenia: No Personality Disorder: No Hospitalization for psychiatric illness: Yes History of  Electroconvulsive Shock Therapy: No Prior Suicide Attempts: No  Physical Exam: Constitutional:  There were no vitals taken for this visit.  General Appearance: alert, oriented, no acute distress, well nourished and well groomed and dressed  Musculoskeletal: Strength & Muscle Tone: within normal limits Gait & Station: normal Patient leans: N/A  Mental status examination Patient is casually dressed and fairly groomed.  She appears anxious but cooperative.  Her speech is clear and coherent.  Her thought processes slow but logical and goal-directed.  She describes her mood is anxious and her affect is mood appropriate.  She denies any auditory or visual hallucination.  She denies any active or passive suicidal thoughts or homicidal thoughts.  Her fund of knowledge is adequate.  Her memory is good.  There were no tremors or shakes.  Her psychomotor activity is normal.  There were no paranoia or any delusions.  She is alert and oriented x3.  Her insight judgment and impulse control is okay.  Established Problem, Stable/Improving (1), Review of Psycho-Social Stressors (1), Decision to obtain old records (1), Review and summation of old records (2), Review of Last Therapy Session (1), Review of Medication Regimen & Side Effects (2) and Review of New Medication or Change in Dosage (2)  Assessment: Axis I: Generalized anxiety disorder; major depressive disorder, recurrent, moderate  Axis II: Deferred  Axis III: GERD  Axis IV: Moderate to severe  Axis V: 60   Plan:  I will discontinue Remeron since patient is not taking it.  We will get records from his primary care physician.  Patient requires a new discussion of Ambien.  She has enough refills on Effexor and Lamictal.  Patient is also taking Crestor for high cholesterol.  She does not remember the dose very well.  Recommended to call us back if she has any question or any concern.  I will see her again in 6 months.  Discussed in detail the  risks and benefits of medication. Time spent 25 minutes.  More than 50% of the time spent in psychoeducation, counseling and coordination of care.  Discuss safety plan that anytime having active suicidal thoughts or homicidal thoughts then patient need to call 911 or go to the local emergency room.  Kayla [name] T., MD 10/09/2013

## 2013-11-02 ENCOUNTER — Other Ambulatory Visit (HOSPITAL_COMMUNITY): Payer: Self-pay | Admitting: Psychiatry

## 2013-11-06 ENCOUNTER — Other Ambulatory Visit (HOSPITAL_COMMUNITY): Payer: Self-pay | Admitting: Psychiatry

## 2013-11-06 DIAGNOSIS — F331 Major depressive disorder, recurrent, moderate: Secondary | ICD-10-CM

## 2013-11-29 ENCOUNTER — Ambulatory Visit (INDEPENDENT_AMBULATORY_CARE_PROVIDER_SITE_OTHER): Payer: 59 | Admitting: Family Medicine

## 2013-11-29 ENCOUNTER — Encounter: Payer: Self-pay | Admitting: Family Medicine

## 2013-11-29 VITALS — BP 109/70 | HR 73 | Ht 66.0 in | Wt 130.0 lb

## 2013-11-29 DIAGNOSIS — M79646 Pain in unspecified finger(s): Secondary | ICD-10-CM

## 2013-11-29 DIAGNOSIS — M25511 Pain in right shoulder: Secondary | ICD-10-CM

## 2013-11-29 DIAGNOSIS — M25519 Pain in unspecified shoulder: Secondary | ICD-10-CM

## 2013-11-29 DIAGNOSIS — M79609 Pain in unspecified limb: Secondary | ICD-10-CM

## 2013-11-29 MED ORDER — METHYLPREDNISOLONE ACETATE 40 MG/ML IJ SUSP
40.0000 mg | Freq: Once | INTRAMUSCULAR | Status: AC
  Administered 2013-11-29: 40 mg via INTRA_ARTICULAR

## 2013-11-29 MED ORDER — METHYLPREDNISOLONE ACETATE 40 MG/ML IJ SUSP
20.0000 mg | Freq: Once | INTRAMUSCULAR | Status: AC
  Administered 2013-11-29: 20 mg via INTRA_ARTICULAR

## 2013-11-29 NOTE — Patient Instructions (Signed)
You have rotator cuff impingement Try to avoid painful activities (overhead activities, lifting with extended arm) as much as possible. Can take tylenol for pain. Subacromial injection may be beneficial to help with pain and to decrease inflammation - you were given this today. Start physical therapy with transition to home exercise program. In 5-7 days start home exercise program with theraband and scapular stabilization exercises daily - these are very important for long term relief even if an injection was given. If not improving at follow-up we will consider further imaging, physical therapy and/or nitro patches.  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 Thumb spica brace, icing 15 minutes at a time 3-4 times a day. Injection given today as well.  Follow up in 5-6 weeks for both issues.

## 2013-11-30 ENCOUNTER — Encounter: Payer: Self-pay | Admitting: Family Medicine

## 2013-11-30 DIAGNOSIS — M79646 Pain in unspecified finger(s): Secondary | ICD-10-CM | POA: Insufficient documentation

## 2013-11-30 DIAGNOSIS — M25511 Pain in right shoulder: Secondary | ICD-10-CM | POA: Insufficient documentation

## 2013-11-30 NOTE — Progress Notes (Signed)
Patient ID: Kayla [name], female   DOB: [birth year], 59 y.o.   MRN: [id number]  PCP: No primary provider on file.  Subjective:   HPI: Patient is a 58 y.o. female here for right shoulder, thumb pain.  1. Right shoulder Present for about 2 months. Just woke up with pain without a known injury. Feels similar to issue she had 2 years ago with left shoulder. Is left handed. Pain worse reaching across body and out to side. + night pain.  2. Right thumb No known injury. Works in [employer] but no change in work requirements Swelling at base of thumb. Has been going on for 2 weeks. Not tried anything for this.  Past Medical History  Diagnosis Date  . Anxiety   . Depression   . GERD (gastroesophageal reflux disease)     Current Outpatient Prescriptions on File Prior to Visit  Medication Sig Dispense Refill  . estrogen-methylTESTOSTERone (ESTRATEST) 1.25-2.5 MG per tablet       . lamoTRIgine (LAMICTAL) 100 MG tablet TAKE 1 TABLET BY MOUTH DAILY.  90 tablet  0  . rosuvastatin (CRESTOR) 5 MG tablet Take 5 mg by mouth daily.      Marland Kitchen venlafaxine XR (EFFEXOR-XR) 150 MG 24 hr capsule TAKE 2 CAPSULES BY MOUTH DAILY.  180 capsule  0  . zolpidem (AMBIEN CR) 12.5 MG CR tablet TAKE 1 TABLET BY MOUTH AT BEDTIME AS NEEDED FOR SLEEP  30 tablet  5   No current facility-administered medications on file prior to visit.    Past Surgical History  Procedure Laterality Date  . Appendectomy    . Abdominal hysterectomy    . Back surgery      No Known Allergies  History   Social History  . Marital Status: Married    Spouse Name: N/A    Number of Children: N/A  . Years of Education: N/A   Occupational History  . Not on file.   Social History Main Topics  . Smoking status: Former Research scientist (life sciences)  . Smokeless tobacco: Not on file  . Alcohol Use: No  . Drug Use: No  . Sexual Activity: Not on file   Other Topics Concern  . Not on file   Social History Narrative  . No narrative on file     History reviewed. No pertinent family history.  BP 109/70  Pulse 73  Ht 5\' 6"  (1.676 m)  Wt 130 lb (58.968 kg)  BMI 20.99 kg/m2  Review of Systems: See HPI above.    Objective:  Physical Exam:  Gen: NAD  Right shoulder: No swelling, ecchymoses.  No gross deformity. No TTP. FROM with painful arc. Positive Hawkins, Neers. Negative Speeds, Yergasons. Strength 5-/5 with empty can and 5/5 resisted internal/external rotation.  Pain empty can. Negative apprehension. NV intact distally.  Right hand: Swelling thenar eminence but no bruising, erythema.  No other deformity. TTP 1st CMC joint.  No tenderness 1st dorsal compartment, volar wrist over carpal tunnel. FROM wrist.  Pain with all motions of thumb. Negative tinels, phalens. NVI distally.    Assessment & Plan:  1. Right shoulder pain - 2/2 rotator cuff impingement.  Went ahead with subacromial injection today - responded well to this in past.  Start home exercises in about 5-7 days.  Consider formal physical therapy.  If not improving at follow-up we will consider further imaging, physical therapy and/or nitro patches.  After informed written consent, patient was seated on exam table. Right shoulder was prepped with alcohol  swab and utilizing posterior approach, patient's right subacromial space was injected with 3:1 marcaine: depomedrol. Patient tolerated the procedure well without immediate complications.  2. Right thumb pain - most likely due to 1st [place] DJD though gout a possibility.  Both treated similarly - injection given as well as start thumb spica brace.  Icing as needed.  F/u 5-6 weeks.  After informed written consent, patient was seated on exam table. Right 1st CMC was identified by ultrasound then prepped with alcohol swab and was injected with 0.5:0.5 marcaine: depomedrol. Patient tolerated the procedure well without immediate complications.

## 2013-11-30 NOTE — Assessment & Plan Note (Signed)
2/2 rotator cuff impingement.  Went ahead with subacromial injection today - responded well to this in past.  Start home exercises in about 5-7 days.  Consider formal physical therapy.  If not improving at follow-up we will consider further imaging, physical therapy and/or nitro patches.  After informed written consent, patient was seated on exam table. Right shoulder was prepped with alcohol swab and utilizing posterior approach, patient's right subacromial space was injected with 3:1 marcaine: depomedrol. Patient tolerated the procedure well without immediate complications.

## 2013-11-30 NOTE — Assessment & Plan Note (Signed)
most likely due to 1st [facility] DJD though gout a possibility.  Both treated similarly - injection given as well as start thumb spica brace.  Icing as needed.  F/u 5-6 weeks.  After informed written consent, patient was seated on exam table. Right 1st CMC was identified by ultrasound then prepped with alcohol swab and was injected with 0.5:0.5 marcaine: depomedrol. Patient tolerated the procedure well without immediate complications.

## 2014-01-31 ENCOUNTER — Other Ambulatory Visit (HOSPITAL_COMMUNITY): Payer: Self-pay | Admitting: Psychiatry

## 2014-01-31 DIAGNOSIS — F331 Major depressive disorder, recurrent, moderate: Secondary | ICD-10-CM

## 2014-04-11 ENCOUNTER — Ambulatory Visit (HOSPITAL_COMMUNITY): Payer: Self-pay | Admitting: Psychiatry

## 2014-04-18 ENCOUNTER — Ambulatory Visit (HOSPITAL_COMMUNITY): Payer: Self-pay | Admitting: Psychiatry

## 2014-05-01 ENCOUNTER — Other Ambulatory Visit (HOSPITAL_COMMUNITY): Payer: Self-pay | Admitting: Psychiatry

## 2014-05-02 ENCOUNTER — Other Ambulatory Visit (HOSPITAL_COMMUNITY): Payer: Self-pay | Admitting: *Deleted

## 2014-05-02 DIAGNOSIS — F331 Major depressive disorder, recurrent, moderate: Secondary | ICD-10-CM

## 2014-05-02 MED ORDER — ZOLPIDEM TARTRATE ER 12.5 MG PO TBCR: EXTENDED_RELEASE_TABLET | ORAL | Status: DC

## 2014-05-09 ENCOUNTER — Ambulatory Visit (INDEPENDENT_AMBULATORY_CARE_PROVIDER_SITE_OTHER): Payer: 59 | Admitting: Psychiatry

## 2014-05-09 ENCOUNTER — Encounter (HOSPITAL_COMMUNITY): Payer: Self-pay | Admitting: Psychiatry

## 2014-05-09 VITALS — Wt 137.0 lb

## 2014-05-09 DIAGNOSIS — F331 Major depressive disorder, recurrent, moderate: Secondary | ICD-10-CM

## 2014-05-09 DIAGNOSIS — F411 Generalized anxiety disorder: Secondary | ICD-10-CM

## 2014-05-09 MED ORDER — ZOLPIDEM TARTRATE ER 12.5 MG PO TBCR: EXTENDED_RELEASE_TABLET | ORAL | Status: DC

## 2014-05-09 NOTE — Progress Notes (Signed)
[facility] Progress Note  Kayla [name] [id number] 58 y.o.  05/09/2014 8:51 AM  Chief Complaint:  Medication management and followup.    History of Present Illness:  Kayla [name] came for her followup appointment.  She is taking Lamictal , Effexor and Ambien as prescribed.  Lately she is complaining of increased back pain.  She has difficulty lifting objects.  Overall her anxiety is under control but sometimes she do get anxious and nervous about her mother.  Her mother is 7 year old and she is the primary caretaker.  She feels guilty about her mother because sometimes she does not want to help her mother.  Her mother does not trust anyone and she feels that she is trapped by taking care of her mother.  Her mother lives close by.  Patient has a good supportive husband.  Patient denies any major panic attack in recent months.  Her appetite is okay.  Her sleep is okay.  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  She is thinking about retirement however she is concerned about finances.  Patient likes her current medication.  She denies any tremors, shakes or any side effects.  She denies any agitation, anger or any mood swing.  She denies any crying spells.  She is scheduled to see her primary care physician Dr. Concha Pyo in 3 weeks.  Suicidal Ideation: No Plan Formed: No Patient has means to carry out plan: No  Homicidal Ideation: No Plan Formed: No Patient has means to carry out plan: No  Review of Systems: Psychiatric: Agitation: No Hallucination: No Depressed Mood: No Insomnia: Yes Hypersomnia: No Altered Concentration: No Feels Worthless: No Grandiose Ideas: No Belief In Special Powers: No New/Increased Substance Abuse: No Compulsions: No  Neurologic: Headache: No Seizure: No Paresthesias: No  Past Medical History: GERD  Outpatient Encounter Prescriptions as of 05/09/2014  Medication Sig  . estrogen-methylTESTOSTERone (ESTRATEST)  1.25-2.5 MG per tablet   . lamoTRIgine (LAMICTAL) 100 MG tablet TAKE 1 TABLET BY MOUTH DAILY.  . rosuvastatin (CRESTOR) 5 MG tablet Take 5 mg by mouth daily.  Marland Kitchen venlafaxine XR (EFFEXOR-XR) 150 MG 24 hr capsule TAKE 2 CAPSULES BY MOUTH DAILY.  Marland Kitchen zolpidem (AMBIEN CR) 12.5 MG CR tablet TAKE 1 TABLET BY MOUTH AT BEDTIME AS NEEDED FOR SLEEP  . [DISCONTINUED] zolpidem (AMBIEN CR) 12.5 MG CR tablet TAKE 1 TABLET BY MOUTH AT BEDTIME AS NEEDED FOR SLEEP    Past Psychiatric History/Hospitalization(s): Patient has one psychiatric hospitalization in 2007 feeling depressed, panic attack and unable to work.  She was taking care of her elderly mother.  She denies any history of suicidal intent, mania or any psychosis.  Patient has tried in the past Zoloft, Remeron Xanax and Risperdal. Anxiety: Yes Bipolar Disorder: No Depression: Yes Mania: No Psychosis: No Schizophrenia: No Personality Disorder: No Hospitalization for psychiatric illness: Yes History of Electroconvulsive Shock Therapy: No Prior Suicide Attempts: No  Physical Exam: Constitutional:  Wt 137 lb (62.143 kg)  General Appearance: alert, oriented, no acute distress and well nourished  Musculoskeletal: Strength & Muscle Tone: within normal limits Gait & Station: normal Patient leans: N/A  Mental status examination Patient is casually dressed and fairly groomed.  She appears anxious but cooperative.  Her speech is clear and coherent.  Her thought processes slow but logical and goal-directed.  She describes her mood is anxious and her affect is mood appropriate.  She denies any auditory or visual hallucination.  She denies any active or passive  suicidal thoughts or homicidal thoughts.  Her fund of knowledge is adequate.  Her memory is good.  There were no tremors or shakes.  Her psychomotor activity is normal.  There were no paranoia or any delusions.  She is alert and oriented x3.  Her insight judgment and impulse control is  okay.  Established Problem, Stable/Improving (1), Review of Psycho-Social Stressors (1), Review of Last Therapy Session (1) and Review of Medication Regimen & Side Effects (2)  Assessment: Axis I: Generalized anxiety disorder; major depressive disorder, recurrent, moderate  Axis II: Deferred  Axis III: GERD  Axis IV: Moderate to severe  Axis V: 60   Plan:  The patient is fairly stable on her current medication.  Continue Lamictal 100 mg daily, Effexor 150 mg capsule daily and Ambien CR 12.5 mg at bedtime.  Discussed medication side effects.  Recommended to call us back if she has any question or any concern.  Patient is scheduled to see primary care physician in 2 weeks for blood work.  Recommended to have blood work faxed to [name].  Followup in 6 months.  Lajada Janes T., MD 05/09/2014

## 2014-06-06 ENCOUNTER — Other Ambulatory Visit (HOSPITAL_COMMUNITY): Payer: Self-pay | Admitting: Psychiatry

## 2014-07-30 ENCOUNTER — Other Ambulatory Visit (HOSPITAL_COMMUNITY): Payer: Self-pay | Admitting: Psychiatry

## 2014-09-10 ENCOUNTER — Other Ambulatory Visit (HOSPITAL_COMMUNITY): Payer: Self-pay | Admitting: Psychiatry

## 2014-09-10 ENCOUNTER — Telehealth (HOSPITAL_COMMUNITY): Payer: Self-pay

## 2014-09-10 ENCOUNTER — Telehealth (HOSPITAL_COMMUNITY): Payer: Self-pay | Admitting: *Deleted

## 2014-09-10 DIAGNOSIS — F331 Major depressive disorder, recurrent, moderate: Secondary | ICD-10-CM

## 2014-09-10 MED ORDER — ZOLPIDEM TARTRATE ER 12.5 MG PO TBCR: EXTENDED_RELEASE_TABLET | ORAL | Status: DC

## 2014-09-10 NOTE — Telephone Encounter (Signed)
Kathlee Nations, MD at 09/10/2014 4:36 PM     Status: Signed       Expand All Collapse All   Okay to refill Ambien     Hope Pigeon, CMA called in Ambien medication to [facility] this date as directed and authorized by Dr. Adele Schilder.

## 2014-09-10 NOTE — Telephone Encounter (Signed)
Okay to refill Ambien? 

## 2014-09-10 NOTE — Telephone Encounter (Signed)
Dr. Adele Schilder,   Patient was last seen on 05-09-2014. Patient was told to follow up in 6 months, plan note is below.  Plan:  The patient is fairly stable on her current medication. Continue Lamictal 100 mg daily, Effexor 150 mg capsule daily and Ambien CR 12.5 mg at bedtime. Discussed medication side effects. Recommended to call us back if she has any question or any concern. Patient is scheduled to see primary care physician in 2 weeks for blood work. Recommended to have blood work faxed to [name]. Followup in 6 months.  Patient's pharmacy sent request for Ambien. Do you want to refill the medication?

## 2014-10-04 ENCOUNTER — Other Ambulatory Visit (HOSPITAL_COMMUNITY): Payer: Self-pay | Admitting: Psychiatry

## 2014-10-04 ENCOUNTER — Telehealth (HOSPITAL_COMMUNITY): Payer: Self-pay | Admitting: *Deleted

## 2014-10-04 ENCOUNTER — Other Ambulatory Visit (HOSPITAL_COMMUNITY): Payer: Self-pay

## 2014-10-04 DIAGNOSIS — F331 Major depressive disorder, recurrent, moderate: Secondary | ICD-10-CM

## 2014-10-04 MED ORDER — ZOLPIDEM TARTRATE ER 12.5 MG PO TBCR: EXTENDED_RELEASE_TABLET | ORAL | Status: DC

## 2014-10-04 NOTE — Telephone Encounter (Signed)
Received fax from [facility].  Request for patient's Ambien to be refilled.  Patient's last refill was 09-10-14. Patient is due to follow up on 11-08-2014. Do you want to refill?  Thank you

## 2014-10-04 NOTE — Telephone Encounter (Signed)
Telephone call with Suezanne Jacquet, pharmacist at [facility] to call in patient's refill of Ambien per request of Dr. Adele Schilder.  Prescription printed out by mistake so Dr. Adele Schilder requested the paper prescription be voided out and the order called in to patient's requested pharmacy.  New order given for Ambien CR 12.5 mg, 1 tablet po at bedtime as needed for sleep, #30 with 1 refill.

## 2014-10-22 ENCOUNTER — Encounter (HOSPITAL_COMMUNITY): Payer: Self-pay | Admitting: Psychiatry

## 2014-10-22 ENCOUNTER — Ambulatory Visit (INDEPENDENT_AMBULATORY_CARE_PROVIDER_SITE_OTHER): Payer: 59 | Admitting: Psychiatry

## 2014-10-22 VITALS — BP 146/81 | HR 97 | Ht 66.0 in | Wt 133.2 lb

## 2014-10-22 DIAGNOSIS — F331 Major depressive disorder, recurrent, moderate: Secondary | ICD-10-CM

## 2014-10-22 DIAGNOSIS — F411 Generalized anxiety disorder: Secondary | ICD-10-CM

## 2014-10-22 MED ORDER — VENLAFAXINE HCL ER 150 MG PO CP24: ORAL_CAPSULE | ORAL | Status: DC

## 2014-10-22 MED ORDER — ARIPIPRAZOLE 5 MG PO TABS: 5.0000 mg | ORAL_TABLET | Freq: Every day | ORAL | Status: DC

## 2014-10-22 NOTE — Progress Notes (Signed)
[facility] [phone] Progress Note  Kayla [name] [id number] 59 y.o.  10/22/2014 10:18 AM  Chief Complaint:  I am feeling depressed.  I don't think my medicine is working.    History of Present Illness:  Kayla [name] came for her followup appointment.  She is compliant with her psychotropic medication but she has noticed increased depression and anxiety in recent months.  She admitted crying spells, lack of motivation and having negative thoughts.  She has decreased energy and she is having a lot of guilt that she cannot help her mother.  Her mother is 81 year old.  She is working longer hours and she does not have enough time for herself.  She wanted to go to the beach but could not go because she wanted to spend time with her mother who requires a lot of help.  Lately patient has noticed irritability, decreased energy, lack of motivation.  However she denies any suicidal thoughts or homicidal thought.  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  She is taking Lamictal, Effexor and Ambien but somewhat she is not able to sleep.  She recently saw her primary care physician however there has been no changes in her medication.  Patient denies any alcohol use or any illegal substance use.  She is thinking about retirement but she is concerned about her finances.  Her appetite is okay.  Her vitals are stable.  Suicidal Ideation: No Plan Formed: No Patient has means to carry out plan: No  Homicidal Ideation: No Plan Formed: No Patient has means to carry out plan: No  Review of Systems: Psychiatric: Agitation: No Hallucination: No Depressed Mood: Yes Insomnia: Yes Hypersomnia: No Altered Concentration: No Feels Worthless: No Grandiose Ideas: No Belief In Special Powers: No New/Increased Substance Abuse: No Compulsions: No  Neurologic: Headache: No Seizure: No Paresthesias: No  Past Medical History: GERD  Outpatient Encounter Prescriptions as of  10/22/2014  Medication Sig  . estrogen-methylTESTOSTERone (ESTRATEST) 1.25-2.5 MG per tablet   . lamoTRIgine (LAMICTAL) 100 MG tablet TAKE 1 TABLET BY MOUTH DAILY.  . rosuvastatin (CRESTOR) 5 MG tablet Take 5 mg by mouth daily.  Marland Kitchen venlafaxine XR (EFFEXOR-XR) 150 MG 24 hr capsule TAKE 2 CAPSULES BY MOUTH DAILY.  Marland Kitchen zolpidem (AMBIEN CR) 12.5 MG CR tablet TAKE 1 TABLET BY MOUTH AT BEDTIME AS NEEDED FOR SLEEP  . [DISCONTINUED] venlafaxine XR (EFFEXOR-XR) 150 MG 24 hr capsule TAKE 2 CAPSULES BY MOUTH DAILY.  Marland Kitchen ARIPiprazole (ABILIFY) 5 MG tablet Take 1 tablet (5 mg total) by mouth daily.  Marland Kitchen HYDROcodone-acetaminophen (NORCO/VICODIN) 5-325 MG per tablet Take 1 tablet by mouth 2 (two) times daily as needed. for pain    Past Psychiatric History/Hospitalization(s): Patient has one psychiatric hospitalization in 2007 feeling depressed, panic attack and unable to work.  She was taking care of her elderly mother.  She denies any history of suicidal intent, mania or any psychosis.  Patient has tried in the past Zoloft, Remeron Xanax and Risperdal. Anxiety: Yes Bipolar Disorder: No Depression: Yes Mania: No Psychosis: No Schizophrenia: No Personality Disorder: No Hospitalization for psychiatric illness: Yes History of Electroconvulsive Shock Therapy: No Prior Suicide Attempts: No  Physical Exam: Constitutional:  BP 146/81 mmHg  Pulse 97  Ht 5\' 6"  (1.676 m)  Wt 133 lb 3.2 oz (60.419 kg)  BMI 21.51 kg/m2  General Appearance: alert, oriented, no acute distress and well nourished  Musculoskeletal: Strength & Muscle Tone: within normal limits Gait & Station: normal Patient leans:  N/A  Mental status examination Patient is casually dressed and fairly groomed.  She is anxious and tearful.  She maintained fair eye contact.  Her speech is slow but clear and coherent.  She described her mood depressed sad and her affect is constricted.  There were no flight of ideas or any loose association.  There were  no paranoia, delusion or any obsessive thoughts.  Her psychomotor activity is slow.  She has no tremors or shakes or any extrapyramidal side effects.  She denies any auditory or visual hallucination.  She denies any active or passive suicidal parts or homicidal thought.  Her cognition is good.  She is alert and oriented 3.  Her insight judgment and impulse control is okay.  Established Problem, Stable/Improving (1), Review of Psycho-Social Stressors (1), Review or order clinical lab tests (1), Established Problem, Worsening (2), Review of Last Therapy Session (1), Review of Medication Regimen & Side Effects (2) and Review of New Medication or Change in Dosage (2)  Assessment: Axis I: Generalized anxiety disorder; major depressive disorder, recurrent, moderate  Axis II: Deferred  Axis III: GERD  Plan:  I review her records from her primary care physician.  Her CBC is normal, her comprehensive metabolic panel is also normal.  Her AST and ALT is within normal range .  Her LDL is 100 and her total cholesterol is normal.  These labs were taken in November 2015.  At this time he does not have any rash or itching with the Lamictal.  I recommended to try low-dose Abilify to help the depression and irritability.  Discussed medication side effects especially metabolic syndrome with Abilify.  At this time I strongly encouraged to see a therapist for coping skills.  We will schedule appointment with Joaquim Lai in this office.  Continue Effexor XR 150 mg 2 capsule daily, Lamictal 100 mg daily and Ambien CR 12.5 at bedtime.  We will consider reducing the Lamictal dose once Abilify start working.  Discussed medication side effects and benefits.  Recommended to call us back if she has any question or any concern.  His worsening of the symptom.  Follow-up in 3-4 weeks  Time spent 25 minutes.  More than 50% of the time spent in psychoeducation, counseling and coordination of care.  Discuss safety plan that anytime having  active suicidal thoughts or homicidal thoughts then patient need to call 911 or go to the local emergency room.  ARFEEN,SYED T., MD 10/22/2014

## 2014-11-05 ENCOUNTER — Other Ambulatory Visit (HOSPITAL_COMMUNITY): Payer: Self-pay | Admitting: Psychiatry

## 2014-11-06 NOTE — Telephone Encounter (Signed)
We will discuss on her appointment.

## 2014-11-06 NOTE — Telephone Encounter (Signed)
Dr. Adele Schilder,   Request for refill on Lamictal was sent from the pharmacy.  Last office note you wanted to increase Lamictal once Abilify started working.  Patient is due to follow up 11-13-14.  Do you want to send in a new prescription for Lamictal?

## 2014-11-08 ENCOUNTER — Ambulatory Visit (HOSPITAL_COMMUNITY): Payer: Self-pay | Admitting: Psychiatry

## 2014-11-13 ENCOUNTER — Ambulatory Visit (INDEPENDENT_AMBULATORY_CARE_PROVIDER_SITE_OTHER): Payer: 59 | Admitting: Psychiatry

## 2014-11-13 ENCOUNTER — Encounter (HOSPITAL_COMMUNITY): Payer: Self-pay | Admitting: Psychiatry

## 2014-11-13 VITALS — BP 136/73 | HR 87 | Ht 66.0 in | Wt 134.9 lb

## 2014-11-13 DIAGNOSIS — F411 Generalized anxiety disorder: Secondary | ICD-10-CM

## 2014-11-13 DIAGNOSIS — F331 Major depressive disorder, recurrent, moderate: Secondary | ICD-10-CM | POA: Diagnosis not present

## 2014-11-13 MED ORDER — ZOLPIDEM TARTRATE ER 12.5 MG PO TBCR: EXTENDED_RELEASE_TABLET | ORAL | Status: DC

## 2014-11-13 MED ORDER — LAMOTRIGINE 100 MG PO TABS: 100.0000 mg | ORAL_TABLET | Freq: Every day | ORAL | Status: DC

## 2014-11-13 MED ORDER — ARIPIPRAZOLE 5 MG PO TABS: 5.0000 mg | ORAL_TABLET | Freq: Every day | ORAL | Status: DC

## 2014-11-13 NOTE — Progress Notes (Signed)
Kayla [name] [phone] Progress Note  Kayla [name] [id number] 59 y.o.  11/13/2014 9:03 AM  Chief Complaint:  I like new medication.  I'm feeling much better.      History of Present Illness:  Kayla [name] came for her followup appointment.  On her last visit we started her on low-dose Abilify she has seen a remarkable improvement with Abilify.  She is more active and less depressed.  She has noticed increased energy however she also noticed some time restlessness, tremors and shakes.  She realized that she is not taking Lamictal for past 2 weeks because her pharmacy did not refill it.  She forgot to call us to talk about refill.  She's not sure if not taking the Lamictal making her more restless or adding Abilify is more restless.  Anyway she does not want to stop the Abilify because she has seen a huge improvement in her depression, anxiety and nervousness.  She denies any feeling of hopelessness or worthlessness.  She do not recall any recent crying spells .  She is compliant with Effexor.  Her sleep is good with Ambien.  She has no rash or itching.  She is scheduled to see Kayla [name] next week. 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  Patient denies any alcohol use or any illegal substance use.  Her appetite is okay.  Her vitals are stable.  Suicidal Ideation: No Plan Formed: No Patient has means to carry out plan: No  Homicidal Ideation: No Plan Formed: No Patient has means to carry out plan: No  Review of Systems: Psychiatric: Agitation: No Hallucination: No Depressed Mood: No Insomnia: No Hypersomnia: No Altered Concentration: No Feels Worthless: No Grandiose Ideas: No Belief In Special Powers: No New/Increased Substance Abuse: No Compulsions: No  Neurologic: Headache: No Seizure: No Paresthesias: No  Past Medical History: GERD  Outpatient Encounter Prescriptions as of 11/13/2014  Medication Sig  . ARIPiprazole (ABILIFY) 5 MG tablet Take  1 tablet (5 mg total) by mouth daily.  Marland Kitchen estrogen-methylTESTOSTERone (ESTRATEST) 1.25-2.5 MG per tablet   . HYDROcodone-acetaminophen (NORCO/VICODIN) 5-325 MG per tablet Take 1 tablet by mouth 2 (two) times daily as needed. for pain  . lamoTRIgine (LAMICTAL) 100 MG tablet Take 1 tablet (100 mg total) by mouth daily.  . rosuvastatin (CRESTOR) 5 MG tablet Take 5 mg by mouth daily.  Marland Kitchen venlafaxine XR (EFFEXOR-XR) 150 MG 24 hr capsule TAKE 2 CAPSULES BY MOUTH DAILY.  Marland Kitchen zolpidem (AMBIEN CR) 12.5 MG CR tablet TAKE 1 TABLET BY MOUTH AT BEDTIME AS NEEDED FOR SLEEP  . [DISCONTINUED] ARIPiprazole (ABILIFY) 5 MG tablet Take 1 tablet (5 mg total) by mouth daily.  . [DISCONTINUED] lamoTRIgine (LAMICTAL) 100 MG tablet TAKE 1 TABLET BY MOUTH DAILY.  . [DISCONTINUED] zolpidem (AMBIEN CR) 12.5 MG CR tablet TAKE 1 TABLET BY MOUTH AT BEDTIME AS NEEDED FOR SLEEP    Past Psychiatric History/Hospitalization(s): Patient has one psychiatric hospitalization in 2007 feeling depressed, panic attack and unable to work.  She was taking care of her elderly mother.  She denies any history of suicidal intent, mania or any psychosis.  Patient has tried in the past Zoloft, Remeron Xanax and Risperdal. Anxiety: Yes Bipolar Disorder: No Depression: Yes Mania: No Psychosis: No Schizophrenia: No Personality Disorder: No Hospitalization for psychiatric illness: Yes History of Electroconvulsive Shock Therapy: No Prior Suicide Attempts: No  Physical Exam: Constitutional:  BP 136/73 mmHg  Pulse 87  Ht 5\' 6"  (1.676 m)  Wt 134  lb 14.4 oz (61.19 kg)  BMI 21.78 kg/m2  General Appearance: alert, oriented, no acute distress and well nourished  Musculoskeletal: Strength & Muscle Tone: within normal limits Gait & Station: normal Patient leans: N/A  Mental status examination Patient is casually dressed and fairly groomed.  She is pleasant and cooperative.  She maintained good eye contact.  Her speech is slow but clear and  coherent.  She described her mood good and her affect is bright from the past.  There were no flight of ideas or any loose association.  There were no paranoia, delusion or any obsessive thoughts.  Her psychomotor activity is normal.  She has no tremors or shakes or any extrapyramidal side effects.  She denies any auditory or visual hallucination.  She denies any active or passive suicidal parts or homicidal thought.  Her cognition is good.  She is alert and oriented 3.  Her insight judgment and impulse control is okay.  Established Problem, Stable/Improving (1), Review of Psycho-Social Stressors (1), Review of Last Therapy Session (1) and Review of Medication Regimen & Side Effects (2)  Assessment: Axis I: Generalized anxiety disorder; major depressive disorder, recurrent, moderate  Axis II: Deferred  Axis III: GERD  Plan:  Patient is showing improvement with Abilify 5 mg.  She may have some restlessness and jitteriness which could be due to noncompliance with Lamictal.  I recommended to go back on Lamictal and a start taking 50 mg for 1 week and then 100 mg daily which she was taking it before.  She will continue Effexor one 50 mg 2 times a day and Ambien CR 12.5 at bedtime.  Discussed medication side effects and benefits.  Encouraged to keep appointment with Kayla [name] for counseling.  I will see her again in 2 months and if patient is still has restlessness and we will add low-dose Cogentin.  Recommended to call us back if she has any question, concern if he feel worsening of the symptom.  Niquan Charnley T., MD 11/13/2014

## 2014-11-26 ENCOUNTER — Ambulatory Visit (INDEPENDENT_AMBULATORY_CARE_PROVIDER_SITE_OTHER): Payer: 59 | Admitting: Clinical

## 2014-11-26 ENCOUNTER — Encounter (HOSPITAL_COMMUNITY): Payer: Self-pay | Admitting: Clinical

## 2014-11-26 DIAGNOSIS — F331 Major depressive disorder, recurrent, moderate: Secondary | ICD-10-CM

## 2014-11-26 DIAGNOSIS — F411 Generalized anxiety disorder: Secondary | ICD-10-CM
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
## 2014-12-10 ENCOUNTER — Telehealth (HOSPITAL_COMMUNITY): Payer: Self-pay

## 2014-12-10 ENCOUNTER — Telehealth (HOSPITAL_COMMUNITY): Payer: Self-pay | Admitting: *Deleted

## 2014-12-10 NOTE — Telephone Encounter (Signed)
Met with Dr. Adele Schilder who authorized this nurse to call [facility] and to read them the prescription he wrote for patient on 11/13/14 for patient's Ambien CR.  Called and spoke with Brayton Layman, pharmacist at [facility] to verify Dr. Marguerite Olea Ambien CR order from 11/13/14 plus one additional refill. Patient to return to see Dr. Adele Schilder on 01/17/15.

## 2014-12-10 NOTE — Telephone Encounter (Signed)
Yes

## 2014-12-10 NOTE — Telephone Encounter (Signed)
Received via fax a RX refill for Ambien.  Patient was given a printed RX on 11-13-14---30 with one refill.  Tried to contact patient.  Contacted pharmacy, spoke with [facility].  Brayton Layman stated she will ask the patient and see if she has the RX.  Brayton Layman stated when patient calls for refills they automatically send a fax.  Brayton Layman stated she will call patient and if RX refill is needed she will have patient contact our office.

## 2014-12-10 NOTE — Telephone Encounter (Signed)
Telephone call from [facility] stating patient was requesting they contact us for a refill of her Ambien.  Patient informed them on 11/13/14 she was running late when she got out of her appointment, so she left [facility] without her prescriptions of Ambien in hand.  Patient requests these be rewritten or called in.  Patient returns for her next evaluation on 01/17/15.

## 2015-01-08 ENCOUNTER — Encounter: Payer: Self-pay | Admitting: Family Medicine

## 2015-01-08 ENCOUNTER — Ambulatory Visit (INDEPENDENT_AMBULATORY_CARE_PROVIDER_SITE_OTHER): Payer: 59 | Admitting: Family Medicine

## 2015-01-08 VITALS — BP 120/75 | HR 80 | Ht 66.0 in

## 2015-01-08 DIAGNOSIS — M79644 Pain in right finger(s): Secondary | ICD-10-CM | POA: Diagnosis not present

## 2015-01-08 MED ORDER — METHYLPREDNISOLONE ACETATE 40 MG/ML IJ SUSP
40.0000 mg | Freq: Once | INTRAMUSCULAR | Status: AC
  Administered 2015-01-08: 20 mg via INTRA_ARTICULAR

## 2015-01-09 NOTE — Assessment & Plan Note (Signed)
due to 1st [facility] DJD.  Injection given today.  Consider using thumb spica brace.  Icing as needed.  F/u 5-6 weeks or prn  After informed written consent, patient was seated on exam table. Right 1st CMC was identified by ultrasound then prepped with alcohol swab and was injected with 0.5:0.5 marcaine: depomedrol. Patient tolerated the procedure well without immediate complications.

## 2015-01-09 NOTE — Progress Notes (Signed)
Patient ID: Kayla [name], female   DOB: 04/25/1956, 59 y.o.   MRN: [id number]  PCP: No primary care provider on file.  Subjective:   HPI: Patient is a 59 y.o. female here for right thumb pain.  11/29/13: 1. Right shoulder Present for about 2 months. Just woke up with pain without a known injury. Feels similar to issue she had 2 years ago with left shoulder. Is left handed. Pain worse reaching across body and out to side. + night pain.  2. Right thumb No known injury. Works in [employer] but no change in work requirements Swelling at base of thumb. Has been going on for 2 weeks. Not tried anything for this.  01/05/15: Patient returns with worsening right thumb pain again. Is left handed. No new injuries. Worse with any thumb motions. No numbness/tingling. Some swelling. Would like repeat injection.  Past Medical History  Diagnosis Date  . Anxiety   . Depression   . GERD (gastroesophageal reflux disease)     Current Outpatient Prescriptions on File Prior to Visit  Medication Sig Dispense Refill  . ARIPiprazole (ABILIFY) 5 MG tablet Take 1 tablet (5 mg total) by mouth daily. 90 tablet 0  . estrogen-methylTESTOSTERone (ESTRATEST) 1.25-2.5 MG per tablet     . HYDROcodone-acetaminophen (NORCO/VICODIN) 5-325 MG per tablet Take 1 tablet by mouth 2 (two) times daily as needed. for pain  0  . lamoTRIgine (LAMICTAL) 100 MG tablet Take 1 tablet (100 mg total) by mouth daily. 90 tablet 0  . rosuvastatin (CRESTOR) 5 MG tablet Take 5 mg by mouth daily.    Marland Kitchen venlafaxine XR (EFFEXOR-XR) 150 MG 24 hr capsule TAKE 2 CAPSULES BY MOUTH DAILY. 180 capsule 0  . zolpidem (AMBIEN CR) 12.5 MG CR tablet TAKE 1 TABLET BY MOUTH AT BEDTIME AS NEEDED FOR SLEEP 30 tablet 1   No current facility-administered medications on file prior to visit.    Past Surgical History  Procedure Laterality Date  . Appendectomy    . Abdominal hysterectomy    . Back surgery      No Known  Allergies  History   Social History  . Marital Status: Married    Spouse Name: N/A  . Number of Children: N/A  . Years of Education: N/A   Occupational History  . Not on file.   Social History Main Topics  . Smoking status: Current Every Day Smoker -- 0.50 packs/day for 30 years    Types: Cigarettes  . Smokeless tobacco: Never Used  . Alcohol Use: No  . Drug Use: No  . Sexual Activity: Yes   Other Topics Concern  . Not on file   Social History Narrative    Family History  Problem Relation Age of Onset  . Depression Mother   . Alcohol abuse Father   . Dementia Maternal Uncle   . Depression Brother     BP 120/75 mmHg  Pulse 80  Ht 5\' 6"  (1.676 m)  Review of Systems: See HPI above.    Objective:  Physical Exam:  Gen: NAD  Right hand: No swelling, bruising, erythema.  No other deformity. TTP 1st CMC joint.  No tenderness 1st dorsal compartment, volar wrist over carpal tunnel. FROM wrist.  Pain with all motions of thumb. Negative tinels. NVI distally.    Assessment & Plan:  1. 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  Injection given today.  Consider using thumb spica brace.  Icing as needed.  F/u 5-6 weeks or  prn  After informed written consent, patient was seated on exam table. Right 1st CMC was identified by ultrasound then prepped with alcohol swab and was injected with 0.5:0.5 marcaine: depomedrol. Patient tolerated the procedure well without immediate complications.

## 2015-01-17 ENCOUNTER — Ambulatory Visit (HOSPITAL_COMMUNITY): Payer: Self-pay | Admitting: Psychiatry

## 2015-01-23 ENCOUNTER — Other Ambulatory Visit (HOSPITAL_COMMUNITY): Payer: Self-pay | Admitting: Psychiatry

## 2015-01-23 DIAGNOSIS — F331 Major depressive disorder, recurrent, moderate: Secondary | ICD-10-CM

## 2015-01-25 NOTE — Telephone Encounter (Signed)
Met with Dr. Adele Schilder who authorized requested refill of patient's prescribed Effexor XR.  New 90 day order e-scribed in to patient's [facility] as was authorized by Dr. Adele Schilder.

## 2015-01-25 NOTE — Telephone Encounter (Signed)
Fax received from [facility] for a refill of patient's prescribed Effexor as patient cancelled appointment on 01/17/15 due to being out of town.  Patient could not be rescheduled until 03/05/15 and running out of medications.

## 2015-02-01 ENCOUNTER — Other Ambulatory Visit (HOSPITAL_COMMUNITY): Payer: Self-pay | Admitting: Psychiatry

## 2015-02-01 DIAGNOSIS — F331 Major depressive disorder, recurrent, moderate: Secondary | ICD-10-CM

## 2015-02-07 NOTE — Telephone Encounter (Signed)
Telephone call with Jeannene Patella, pharmacist at [facility] to call in patient's approved Ambien CR 12.5mg  order plus one refill authorized by Dr. Lovena Le on 02/06/15.  Called patient to inform her new order was at her [facility].

## 2015-02-11 ENCOUNTER — Other Ambulatory Visit (HOSPITAL_COMMUNITY): Payer: Self-pay | Admitting: Psychiatry

## 2015-02-11 DIAGNOSIS — F331 Major depressive disorder, recurrent, moderate: Secondary | ICD-10-CM

## 2015-02-12 NOTE — Telephone Encounter (Signed)
Met with Dr. Salem Senate who authorized a 90 day refill of patient's prescribed Lamictal and new order 90 day order e-scribed to patient's [facility].  Patient to keep appointment 03/05/15 for any further refills. Baltazar Apo, pharmacist at [facility] to verify order was received.

## 2015-03-05 ENCOUNTER — Encounter (HOSPITAL_COMMUNITY): Payer: Self-pay | Admitting: Psychiatry

## 2015-03-05 ENCOUNTER — Ambulatory Visit (INDEPENDENT_AMBULATORY_CARE_PROVIDER_SITE_OTHER): Payer: 59 | Admitting: Psychiatry

## 2015-03-05 VITALS — BP 125/70 | HR 78 | Ht 66.0 in | Wt 128.0 lb

## 2015-03-05 DIAGNOSIS — F411 Generalized anxiety disorder: Secondary | ICD-10-CM

## 2015-03-05 DIAGNOSIS — F331 Major depressive disorder, recurrent, moderate: Secondary | ICD-10-CM | POA: Diagnosis not present

## 2015-03-05 MED ORDER — ZOLPIDEM TARTRATE ER 12.5 MG PO TBCR: 12.5000 mg | EXTENDED_RELEASE_TABLET | Freq: Every evening | ORAL | Status: DC | PRN

## 2015-03-05 MED ORDER — ARIPIPRAZOLE 5 MG PO TABS: 5.0000 mg | ORAL_TABLET | Freq: Every day | ORAL | Status: DC

## 2015-03-05 NOTE — Progress Notes (Signed)
[facility] Progress Note  GENEVER HENTGES [id number] 59 y.o.  03/05/2015 11:05 AM  Chief Complaint:  Medication management and follow-up.        History of Present Illness:  Kayla [name] came for her followup appointment.  She is taking her medication and denies any side effects.  Initially she felt somewhat anxious with Abilify but now she is tolerating very well.  She reported medicine is helping her however she [place] about [place]  Patient told that she and her husband bought a beach house however after buying she feel regret because she cannot enjoy as much.  She is taking care of her mother who is 55 year old and she feels guilty leaving her.  Now she is hoping to flip and self on next spring.  Overall she described her mood has been stable.  She denies any irritability, anger, severe mood swing.  She denies any agitation or anger.  She denies any major panic attack .  She admitted consuming a lot of time taking care of her elderly mother.  However she had a very supportive husband and she feel blessed because he understand her very well.  Patient denies any shakes, tremors, EPS or any side effects.  Her energy level is okay.  She denies any feeling of hopelessness or worthlessness.  She denies any crying spells.  She admitted her sadness is due to situation related to buying a house.  She saw a therapist however does not feel that she needed counseling because she no her stressors and she is working on it.  Patient denies drinking or using any illegal substances.  Her appetite is okay.  Her vitals are stable.  She sleeping good.  She likes Ambien which is helping her sleep.  Suicidal Ideation: No Plan Formed: No Patient has means to carry out plan: No  Homicidal Ideation: No Plan Formed: No Patient has means to carry out plan: No  Review of Systems: Psychiatric: Agitation: No Hallucination: No Depressed Mood: No Insomnia: No Hypersomnia:  No Altered Concentration: No Feels Worthless: No Grandiose Ideas: No Belief In Special Powers: No New/Increased Substance Abuse: No Compulsions: No  Neurologic: Headache: No Seizure: No Paresthesias: No  Past Medical History: GERD  Outpatient Encounter Prescriptions as of 03/05/2015  Medication Sig  . ARIPiprazole (ABILIFY) 5 MG tablet Take 1 tablet (5 mg total) by mouth daily.  . [DISCONTINUED] ARIPiprazole (ABILIFY) 5 MG tablet Take 1 tablet (5 mg total) by mouth daily.  Marland Kitchen estrogen-methylTESTOSTERone (ESTRATEST) 1.25-2.5 MG per tablet   . HYDROcodone-acetaminophen (NORCO/VICODIN) 5-325 MG per tablet Take 1 tablet by mouth 2 (two) times daily as needed. for pain  . lamoTRIgine (LAMICTAL) 100 MG tablet TAKE 1 TABLET (100 MG TOTAL) BY MOUTH DAILY.  . rosuvastatin (CRESTOR) 5 MG tablet Take 5 mg by mouth daily.  Marland Kitchen venlafaxine XR (EFFEXOR-XR) 150 MG 24 hr capsule TAKE 2 CAPSULES BY MOUTH DAILY.  Marland Kitchen zolpidem (AMBIEN CR) 12.5 MG CR tablet Take 1 tablet (12.5 mg total) by mouth at bedtime as needed. for sleep  . [DISCONTINUED] zolpidem (AMBIEN CR) 12.5 MG CR tablet TAKE 1 TABLET BY MOUTH AT BEDTIME AS NEEDED FOR SLEEP   No facility-administered encounter medications on file as of 03/05/2015.    Past Psychiatric History/Hospitalization(s): Patient has one psychiatric hospitalization in 2007 feeling depressed, panic attack and unable to work.  She was taking care of her elderly mother.  She denies any history of suicidal intent, mania  or any psychosis.  Patient has tried in the past Zoloft, Remeron Xanax and Risperdal. Anxiety: Yes Bipolar Disorder: No Depression: Yes Mania: No Psychosis: No Schizophrenia: No Personality Disorder: No Hospitalization for psychiatric illness: Yes History of Electroconvulsive Shock Therapy: No Prior Suicide Attempts: No  Physical Exam: Constitutional:  BP 125/70 mmHg  Pulse 78  Ht 5\' 6"  (1.676 m)  Wt 128 lb (58.06 kg)  BMI 20.67 kg/m2  General  Appearance: alert, oriented, no acute distress and well nourished  Musculoskeletal: Strength & Muscle Tone: within normal limits Gait & Station: normal Patient leans: N/A  Mental status examination Patient is casually dressed and fairly groomed.  She is cooperative and maintained good eye contact.  Her speech is slow but clear and coherent.  She described her mood euthymic and her affect is appropriate.  There were no flight of ideas or any loose association.  There were no paranoia, delusion or any obsessive thoughts.  Her psychomotor activity is normal.  She has no tremors or shakes or any extrapyramidal side effects.  She denies any auditory or visual hallucination.  She denies any active or passive suicidal parts or homicidal thought.  Her cognition is good.  She is alert and oriented 3.  Her insight judgment and impulse control is okay.  Established Problem, Stable/Improving (1), Review of Psycho-Social Stressors (1), Review of Last Therapy Session (1) and Review of Medication Regimen & Side Effects (2)  Assessment: Axis I: Generalized anxiety disorder; major depressive disorder, recurrent, moderate  Axis II: Deferred  Axis III: GERD  Plan:  Discuss patient's stressors and encouraged to see a therapist but patient declined because she is working on her issues without any counselors help.  She is compliant with Lamictal, Abilify and Ambien CR 12.5.  She has no rash or itching.  She has no headaches.  Discussed medication side effects and benefits.  Recommended to call us back if she has any question, concern if he feel worsening of the symptom.  Discuss safety plan that anytime having active suicidal thoughts or homicidal thoughts and she need to call 911 or go to the local emergency room.  Follow-up in 3 months.  Darcus Edds T., MD 03/05/2015

## 2015-03-27 ENCOUNTER — Telehealth (HOSPITAL_COMMUNITY): Payer: Self-pay

## 2015-03-27 NOTE — Telephone Encounter (Signed)
Medication problem - left pt a message to follow up on her message she did not think she was doing as well since she saw Dr. Adele Schilder 03/05/15 and was questioning if she may need an increase in Abilify or Effexor XR.  Patient reported some increase in symptoms of depression and anxiety but did not elaborate on these symptoms during her message left.  Patient requested Dr. Adele Schilder consider a possible increase in her Abilify or Effexor to help.

## 2015-03-28 NOTE — Telephone Encounter (Signed)
I returned patient's phone call.  She continued to have depressive symptoms.  She has a lot of regret about beach house.  She denies any suicidal thoughts but requesting medicine need to be adjusted.  Recommended to increase Lamictal from 100-150 mg daily.  Discussed medication side effects especially rash and in that case she needed to stop the medication immediately.  Recommended to call us back if symptoms do not improve.

## 2015-04-25 ENCOUNTER — Telehealth (HOSPITAL_COMMUNITY): Payer: Self-pay

## 2015-04-25 DIAGNOSIS — F331 Major depressive disorder, recurrent, moderate: Secondary | ICD-10-CM

## 2015-04-25 MED ORDER — LAMOTRIGINE 150 MG PO TABS: 150.0000 mg | ORAL_TABLET | Freq: Every day | ORAL | Status: DC

## 2015-04-25 NOTE — Telephone Encounter (Signed)
Medication refill request. - Met with Dr. Adele Schilder to question if he approved a new 90 day order for pt's Lamictal be e-scribed to [facility]. Pt. last seen 03/05/15. Agreed to contact pt. first to verify if she increased 03/28/15.  Left patient a message to question if she had increased her prescribed Lamictal to 17m as she and Dr. AAdele Schilderdiscussed by phone call on 03/28/15 and to see if doing better with mood or any problems with increase.  Requested Ms. Belvin call this nurse back with a status report before Dr. AAdele Schildersends a new Lamictal order in and to verify dosage taking. Called patient at [phone]

## 2015-04-25 NOTE — Telephone Encounter (Signed)
Medication management - Telephone call with patient to verify she is taking Lamictal total 150mg s a day now.  Patient reports mood has improved and has been taking Lamictal 100mg  one at bedtime and 1/2 (50mg ) each morning.  Would like in one dosage.

## 2015-04-25 NOTE — Telephone Encounter (Signed)
Met with Dr. Adele Schilder who authorized a 90 day order for Lamictal, one a day, #90 with no refills for patient and e-scribed order as authorized to patient's [facility]

## 2015-05-07 ENCOUNTER — Telehealth (HOSPITAL_COMMUNITY): Payer: Self-pay

## 2015-05-07 DIAGNOSIS — F331 Major depressive disorder, recurrent, moderate: Secondary | ICD-10-CM

## 2015-05-07 MED ORDER — VENLAFAXINE HCL ER 150 MG PO CP24: ORAL_CAPSULE | ORAL | Status: DC

## 2015-05-07 NOTE — Telephone Encounter (Signed)
Medication management - Telephone call with patient after speaking with Dr. Adele Schilder to ensure patient is still taking Effexor as ordered.  Informed patient Dr. Adele Schilder approved a new 90 day order and would be e-scribed to patient's [facility] pharamcy. New 90 day order for patient's Effexor XR e-scribed to [facility] as requested and approved by Dr. Adele Schilder.

## 2015-06-05 ENCOUNTER — Ambulatory Visit (INDEPENDENT_AMBULATORY_CARE_PROVIDER_SITE_OTHER): Payer: 59 | Admitting: Psychiatry

## 2015-06-05 ENCOUNTER — Encounter (HOSPITAL_COMMUNITY): Payer: Self-pay | Admitting: Psychiatry

## 2015-06-05 VITALS — BP 122/78 | HR 75 | Ht 66.0 in | Wt 137.0 lb

## 2015-06-05 DIAGNOSIS — F411 Generalized anxiety disorder: Secondary | ICD-10-CM

## 2015-06-05 DIAGNOSIS — F331 Major depressive disorder, recurrent, moderate: Secondary | ICD-10-CM | POA: Diagnosis not present

## 2015-06-05 MED ORDER — ZOLPIDEM TARTRATE ER 12.5 MG PO TBCR: 12.5000 mg | EXTENDED_RELEASE_TABLET | Freq: Every evening | ORAL | Status: DC | PRN

## 2015-06-05 MED ORDER — ARIPIPRAZOLE 5 MG PO TABS: 5.0000 mg | ORAL_TABLET | Freq: Every day | ORAL | Status: DC

## 2015-06-05 NOTE — Progress Notes (Signed)
[place] Progress Note  GRABIELA Kayla [name] [id number] 59 y.o.  06/05/2015 9:05 AM  Chief Complaint:  I'm feeling better since medicine increased.         History of Present Illness:  [name] came for her followup appointment.  Now she is taking Lamictal 150 mg which was increased August 24.  She had called them complaining of increased depression, crying spells and anxiety.  She is feeling much better.  She is relieved that she already made Kayla [name] decision and now she like to enjoy the beach house.  She is going next week.  Since limited in range denies any feeling of depression or any crying spells.  Her energy level is good.  Sometimes she feels tired in the morning but overall she described her mood is much improved.  She denies any feeling of hopelessness or worthlessness.  She is taking care of her 1 year old mother and her mother is happy from her.  She admitted sometime feeling guilty leaving her mother but she handles her emotion very well.  She denies any paranoia, hallucination, irritability, anger or any mood swing.  Her sleep is good.  She has no tremors, or shakes.  She is compliant with Abilify, Lamictal, Ambien without any side effects.  She has no tremors, shakes, rash or any itching.  Her appetite is okay.  Her vitals are stable.  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  Patient denies drinking or using any illegal substances.  Suicidal Ideation: No Plan Formed: No Patient has means to carry out plan: No  Homicidal Ideation: No Plan Formed: No Patient has means to carry out plan: No  Review of Systems: Psychiatric: Agitation: No Hallucination: No Depressed Mood: No Insomnia: No Hypersomnia: No Altered Concentration: No Feels Worthless: No Grandiose Ideas: No Belief In Special Powers: No New/Increased Substance Abuse: No Compulsions: No  Neurologic: Headache: No Seizure: No Paresthesias: No  Past Medical  History: GERD  Outpatient Encounter Prescriptions as of 06/05/2015  Medication Sig  . ARIPiprazole (ABILIFY) 5 MG tablet Take 1 tablet (5 mg total) by mouth daily.  Marland Kitchen atorvastatin (LIPITOR) 20 MG tablet   . estrogen-methylTESTOSTERone (ESTRATEST) 1.25-2.5 MG per tablet   . HYDROcodone-acetaminophen (NORCO/VICODIN) 5-325 MG per tablet Take 1 tablet by mouth 2 (two) times daily as needed. for pain  . lamoTRIgine (LAMICTAL) 150 MG tablet Take 1 tablet (150 mg total) by mouth daily.  . minocycline (MINOCIN,DYNACIN) 100 MG capsule   . triamterene-hydrochlorothiazide (MAXZIDE-25) 37.5-25 MG tablet   . venlafaxine XR (EFFEXOR-XR) 150 MG 24 hr capsule TAKE 2 CAPSULES BY MOUTH DAILY.  Marland Kitchen zolpidem (AMBIEN CR) 12.5 MG CR tablet Take 1 tablet (12.5 mg total) by mouth at bedtime as needed. for sleep  . [DISCONTINUED] ARIPiprazole (ABILIFY) 5 MG tablet Take 1 tablet (5 mg total) by mouth daily.  . [DISCONTINUED] rosuvastatin (CRESTOR) 5 MG tablet Take 5 mg by mouth daily.  . [DISCONTINUED] zolpidem (AMBIEN CR) 12.5 MG CR tablet Take 1 tablet (12.5 mg total) by mouth at bedtime as needed. for sleep   No facility-administered encounter medications on file as of 06/05/2015.    Past Psychiatric History/Hospitalization(s): Patient has one psychiatric hospitalization in 2007 feeling depressed, panic attack and unable to work.  She was taking care of her elderly mother.  She denies any history of suicidal intent, mania or any psychosis.  Patient has tried in the past Zoloft, Remeron Xanax and Risperdal. Anxiety: Yes  Bipolar Disorder: No Depression: Yes Mania: No Psychosis: No Schizophrenia: No Personality Disorder: No Hospitalization for psychiatric illness: Yes History of Electroconvulsive Shock Therapy: No Prior Suicide Attempts: No  Physical Exam: Constitutional:  BP 122/78 mmHg  Pulse 75  Ht 5\' 6"  (1.676 m)  Wt 137 lb (62.143 kg)  BMI 22.12 kg/m2  General Appearance: alert, oriented, no acute  distress and well nourished  Musculoskeletal: Strength & Muscle Tone: within normal limits Gait & Station: normal Patient leans: N/Kayla [name]  Psychiatric Specialty Exam: Physical Exam  ROS  Blood pressure 122/78, pulse 75, height 5\' 6"  (1.676 m), weight 137 lb (62.143 kg).Body mass index is 22.12 kg/(m^2).  General Appearance: Casual  Eye Contact::  Good  Speech:  Normal Rate  Volume:  Normal  Mood:  Euthymic  Affect:  Congruent  Thought Process:  Goal Directed  Orientation:  Full (Time, Place, and Person)  Thought Content:  WDL  Suicidal Thoughts:  No  Homicidal Thoughts:  No  Memory:  Immediate;   Fair Recent;   Good Remote;   Good  Judgement:  Good  Insight:  Good  Psychomotor Activity:  Normal  Concentration:  Good  Recall:  Good  Fund of Knowledge:  Good  Language:  Good  Akathisia:  No  Handed:  Right  AIMS (if indicated):     Assets:  Communication Skills Desire for Improvement Financial Resources/Insurance Housing Physical Health Social Support  ADL's:  Intact  Cognition:  WNL  Sleep:       Established Problem, Stable/Improving (1), Review of Psycho-Social Stressors (1), Review of Last Therapy Session (1) and Review of Medication Regimen & Side Effects (2)  Assessment: Axis I: Generalized anxiety disorder; major depressive disorder, recurrent, moderate  Axis II: Deferred  Axis III: GERD  Plan:  Patient is doing better on her current medication.  She has no side effects.  At this time he does not feel that she need to see any therapist.  She has no concern from medication.  I will continue Lamictal 150 mg daily, Abilify 5 mg daily, Effexor 300 mg daily and Ambien CR 12.5 at bedtime.  Discussed medication side effects.  Recommended to call us back if she has any question.  Follow-up in 3 months.  Ehtan Delfavero T., MD 06/05/2015

## 2015-07-02 ENCOUNTER — Other Ambulatory Visit (HOSPITAL_BASED_OUTPATIENT_CLINIC_OR_DEPARTMENT_OTHER): Payer: Self-pay | Admitting: Internal Medicine

## 2015-07-02 ENCOUNTER — Other Ambulatory Visit (HOSPITAL_BASED_OUTPATIENT_CLINIC_OR_DEPARTMENT_OTHER): Payer: Self-pay | Admitting: Registered Nurse

## 2015-07-02 DIAGNOSIS — Z1231 Encounter for screening mammogram for malignant neoplasm of breast: Secondary | ICD-10-CM

## 2015-07-08 ENCOUNTER — Telehealth: Payer: Self-pay | Admitting: Family Medicine

## 2015-07-08 MED ORDER — DICLOFENAC SODIUM 1 % TD GEL: 2.0000 g | Freq: Four times a day (QID) | TRANSDERMAL | Status: DC

## 2015-07-08 NOTE — Telephone Encounter (Signed)
-----   Message from Ophelia Shoulder, [place] sent at 07/08/2015  1:06 PM EST ----- She wanted to know if we could send in a script for Voltaren gel 1% to the pharmacy downstairs. Thanks!

## 2015-07-08 NOTE — Telephone Encounter (Signed)
Sent in downstairs.  Please notify patient.  Thanks!

## 2015-07-09 ENCOUNTER — Telehealth: Payer: Self-pay | Admitting: *Deleted

## 2015-07-22 ENCOUNTER — Other Ambulatory Visit (HOSPITAL_COMMUNITY): Payer: Self-pay | Admitting: Psychiatry

## 2015-07-22 DIAGNOSIS — F331 Major depressive disorder, recurrent, moderate: Secondary | ICD-10-CM

## 2015-07-22 NOTE — Telephone Encounter (Signed)
Met with Dr. Salem Senate helping to cover for Dr. Adele Schilder out this week who approved a new 90 day order for patient's Lamictal and order e-scribed to patient's [facility] as ordered.  Patient returns on 09/05/15 for next evaluation.

## 2015-07-26 ENCOUNTER — Ambulatory Visit (HOSPITAL_BASED_OUTPATIENT_CLINIC_OR_DEPARTMENT_OTHER)
Admission: RE | Admit: 2015-07-26 | Discharge: 2015-07-26 | Disposition: A | Discharge status: Home / Self Care | Payer: 59 | Source: Ambulatory Visit | Attending: Internal Medicine | Admitting: Internal Medicine

## 2015-07-26 DIAGNOSIS — Z1231 Encounter for screening mammogram for malignant neoplasm of breast: Secondary | ICD-10-CM | POA: Insufficient documentation

## 2015-08-06 ENCOUNTER — Other Ambulatory Visit (HOSPITAL_COMMUNITY): Payer: Self-pay | Admitting: Psychiatry

## 2015-08-06 DIAGNOSIS — F331 Major depressive disorder, recurrent, moderate: Secondary | ICD-10-CM

## 2015-08-06 MED FILL — ATORVASTATIN 20 MG TABLET: 20 | 90 days supply | Qty: 45 | Fill #2

## 2015-08-06 MED FILL — TRIAMTERENE/HCTZ 37.5/25 TB: 37.5-25 | 30 days supply | Qty: 15 | Fill #1

## 2015-08-07 NOTE — Telephone Encounter (Signed)
Met with Dr. Adele Schilder who approved a new 90 day order for patient's Effexor XR.  A new order e-scribed to [facility] as approved by Dr.Arfeen for Venlafaxine XR 150 mg capsules, 2 daily, #180 with no refills.  Patient to return for next evaluation on 09/05/15.

## 2015-08-08 MED FILL — VENLAFAXINE HCL ER 150 MG C: 150 | 90 days supply | Qty: 180 | Fill #0

## 2015-08-29 DIAGNOSIS — F17210 Nicotine dependence, cigarettes, uncomplicated: Secondary | ICD-10-CM | POA: Diagnosis not present

## 2015-08-29 DIAGNOSIS — Z23 Encounter for immunization: Secondary | ICD-10-CM | POA: Diagnosis not present

## 2015-08-29 DIAGNOSIS — E7800 Pure hypercholesterolemia, unspecified: Secondary | ICD-10-CM | POA: Diagnosis not present

## 2015-08-29 DIAGNOSIS — Z01419 Encounter for gynecological examination (general) (routine) without abnormal findings: Secondary | ICD-10-CM | POA: Diagnosis not present

## 2015-08-29 DIAGNOSIS — Z0000 Encounter for general adult medical examination without abnormal findings: Secondary | ICD-10-CM | POA: Diagnosis not present

## 2015-08-29 DIAGNOSIS — Z0001 Encounter for general adult medical examination with abnormal findings: Secondary | ICD-10-CM | POA: Diagnosis not present

## 2015-09-03 MED FILL — TRIAMTERENE/HCTZ 37.5/25 TB: 37.5-25 | 60 days supply | Qty: 60 | Fill #0

## 2015-09-05 ENCOUNTER — Ambulatory Visit (INDEPENDENT_AMBULATORY_CARE_PROVIDER_SITE_OTHER): Payer: 59 | Admitting: Psychiatry

## 2015-09-05 ENCOUNTER — Encounter (HOSPITAL_COMMUNITY): Payer: Self-pay | Admitting: Psychiatry

## 2015-09-05 VITALS — BP 122/73 | HR 78 | Ht 66.0 in | Wt 140.2 lb

## 2015-09-05 DIAGNOSIS — F411 Generalized anxiety disorder: Secondary | ICD-10-CM

## 2015-09-05 DIAGNOSIS — F331 Major depressive disorder, recurrent, moderate: Secondary | ICD-10-CM | POA: Diagnosis not present

## 2015-09-05 MED ORDER — ARIPIPRAZOLE 2 MG PO TABS: 2.0000 mg | ORAL_TABLET | Freq: Every day | ORAL | Status: DC

## 2015-09-05 MED ORDER — ZOLPIDEM TARTRATE ER 12.5 MG PO TBCR: 12.5000 mg | EXTENDED_RELEASE_TABLET | Freq: Every evening | ORAL | Status: DC | PRN

## 2015-09-05 MED FILL — ZOLPIDEM TART ER 12.5 MG TA: 12.5 | 30 days supply | Qty: 30 | Fill #0

## 2015-09-05 MED FILL — ARIPiprazole 2 MG TABS: 2 | 90 days supply | Qty: 90 | Fill #0

## 2015-09-05 NOTE — Progress Notes (Signed)
[place] Progress Note  Kayla [name] [id number] 60 y.o.  09/05/2015 10:42 AM  Chief Complaint:  I am feeling restless and gaining weight with Abilify.           History of Present Illness:  Kayla [name] came for her followup appointment.  She is taking her medication as prescribed however she has noticed some restlessness and weight gain with Abilify.  Her depression is well controlled.  She denies any irritability, crying spells or any feeling of hopelessness or worthlessness.  She had a good Christmas.  She still have issues related to her mother but she is handling better.  She denies any crying spells.  She denies any feeling of hopelessness or worthlessness.  She sleeping much better.  She continues to involved in taking care of her 77 year old mother.  Patient denies any paranoia or any hallucination.  She has noticed tremors or shakes but admitted sometime feeling restless and she is concerned about weight gain.  She likes her job and there has been no issues.  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  Patient denies drinking or using any illegal substances. She lives with her husband who is very supportive.  She has gained 4 pounds since the last visit.  Suicidal Ideation: No Plan Formed: No Patient has means to carry out plan: No  Homicidal Ideation: No Plan Formed: No Patient has means to carry out plan: No  Review of Systems: Psychiatric: Agitation: No Hallucination: No Depressed Mood: No Insomnia: No Hypersomnia: No Altered Concentration: No Feels Worthless: No Grandiose Ideas: No Belief In Special Powers: No New/Increased Substance Abuse: No Compulsions: No  Neurologic: Headache: No Seizure: No Paresthesias: No  Past Medical History: GERD  Outpatient Encounter Prescriptions as of 09/05/2015  Medication Sig  . ARIPiprazole (ABILIFY) 2 MG tablet Take 1 tablet (2 mg total) by mouth daily.  Marland Kitchen atorvastatin (LIPITOR) 20 MG  tablet   . diclofenac sodium (VOLTAREN) 1 % GEL Apply 2 g topically 4 (four) times daily.  Marland Kitchen estrogen-methylTESTOSTERone (ESTRATEST) 1.25-2.5 MG per tablet   . HYDROcodone-acetaminophen (NORCO/VICODIN) 5-325 MG per tablet Take 1 tablet by mouth 2 (two) times daily as needed. for pain  . lamoTRIgine (LAMICTAL) 150 MG tablet TAKE 1 TABLET (150 MG TOTAL) BY MOUTH DAILY.  . minocycline (MINOCIN,DYNACIN) 100 MG capsule   . triamterene-hydrochlorothiazide (MAXZIDE-25) 37.5-25 MG tablet   . venlafaxine XR (EFFEXOR-XR) 150 MG 24 hr capsule TAKE 2 CAPSULES BY MOUTH DAILY.  Marland Kitchen zolpidem (AMBIEN CR) 12.5 MG CR tablet Take 1 tablet (12.5 mg total) by mouth at bedtime as needed. for sleep  . [DISCONTINUED] ARIPiprazole (ABILIFY) 5 MG tablet Take 1 tablet (5 mg total) by mouth daily.  . [DISCONTINUED] zolpidem (AMBIEN CR) 12.5 MG CR tablet Take 1 tablet (12.5 mg total) by mouth at bedtime as needed. for sleep   No facility-administered encounter medications on file as of 09/05/2015.    Past Psychiatric History/Hospitalization(s): Patient has one psychiatric hospitalization in 2007 feeling depressed, panic attack and unable to work.  She was taking care of her elderly mother.  She denies any history of suicidal intent, mania or any psychosis.  Patient has tried in the past Zoloft, Remeron Xanax and Risperdal. Anxiety: Yes Bipolar Disorder: No Depression: Yes Mania: No Psychosis: No Schizophrenia: No Personality Disorder: No Hospitalization for psychiatric illness: Yes History of Electroconvulsive Shock Therapy: No Prior Suicide Attempts: No  Physical Exam: Constitutional:  BP 122/73 mmHg  Pulse 78  Ht 5\' 6"  (1.676 m)  Wt 140 lb 3.2 oz (63.594 kg)  BMI 22.64 kg/m2  General Appearance: alert, oriented, no acute distress and well nourished  Musculoskeletal: Strength & Muscle Tone: within normal limits Gait & Station: normal Patient leans: N/A  Psychiatric Specialty Exam: Physical Exam  Review  of Systems  Constitutional: Negative for weight loss.    Blood pressure 122/73, pulse 78, height 5\' 6"  (1.676 m), weight 140 lb 3.2 oz (63.594 kg).Body mass index is 22.64 kg/(m^2).  General Appearance: Casual  Eye Contact::  Good  Speech:  Normal Rate  Volume:  Normal  Mood:  Euthymic  Affect:  Congruent  Thought Process:  Goal Directed  Orientation:  Full (Time, Place, and Person)  Thought Content:  WDL  Suicidal Thoughts:  No  Homicidal Thoughts:  No  Memory:  Immediate;   Fair Recent;   Good Remote;   Good  Judgement:  Good  Insight:  Good  Psychomotor Activity:  Normal  Concentration:  Good  Recall:  Good  Fund of Knowledge:  Good  Language:  Good  Akathisia:  No  Handed:  Right  AIMS (if indicated):     Assets:  Communication Skills Desire for Improvement Financial Resources/Insurance Housing Physical Health Social Support  ADL's:  Intact  Cognition:  WNL  Sleep:       Established Problem, Stable/Improving (1), Review of Psycho-Social Stressors (1), Review of Last Therapy Session (1), Review of Medication Regimen & Side Effects (2) and Review of New Medication or Change in Dosage (2)  Assessment: Axis I: Generalized anxiety disorder; major depressive disorder, recurrent, moderate  Axis II: Deferred  Axis III: GERD  Plan:  Discuss medication side effects.  Recommended to try Abilify 2 mg since she is complaining of restlessness and weight gain from Abilify.  I also reminded to call us if she feels symptoms of depression get worse .  She wants to continue Lamictal and Ambien.  She has no rash or itching.  Discussed medication side effects and benefits.  I will continue Lamictal 150 mg daily, Effexor 300 mg daily and Ambien CR 12.5 at bedtime.  She will try Abilify 2 mg daily.  Follow-up in 3 months.  Ashtyn Meland T., MD 09/05/2015

## 2015-09-12 MED FILL — ESTRADIOL 1 MG TABLET: 1 | 90 days supply | Qty: 90 | Fill #1

## 2015-09-24 MED FILL — MINOCYCLINE 100 MG CAPSULE: 100 | 30 days supply | Qty: 60 | Fill #3

## 2015-10-01 DIAGNOSIS — H524 Presbyopia: Secondary | ICD-10-CM | POA: Diagnosis not present

## 2015-10-01 DIAGNOSIS — H52203 Unspecified astigmatism, bilateral: Secondary | ICD-10-CM | POA: Diagnosis not present

## 2015-10-01 DIAGNOSIS — H2513 Age-related nuclear cataract, bilateral: Secondary | ICD-10-CM | POA: Diagnosis not present

## 2015-10-01 DIAGNOSIS — H43392 Other vitreous opacities, left eye: Secondary | ICD-10-CM | POA: Diagnosis not present

## 2015-10-01 DIAGNOSIS — H5203 Hypermetropia, bilateral: Secondary | ICD-10-CM | POA: Diagnosis not present

## 2015-10-01 MED FILL — ZOLPIDEM TART ER 12.5 MG TA: 12.5 | 30 days supply | Qty: 30 | Fill #1

## 2015-10-02 ENCOUNTER — Other Ambulatory Visit (HOSPITAL_COMMUNITY): Payer: Self-pay | Admitting: Psychiatry

## 2015-10-02 ENCOUNTER — Telehealth (HOSPITAL_COMMUNITY): Payer: Self-pay

## 2015-10-02 NOTE — Telephone Encounter (Signed)
Patient has a panic attack this morning.  She is not sure what trigger but she felt dizzy, shortness of breath, excessive sweating and forgetful.  She could not go to work.  Her husband gave her Xanax and she was able to calm down.  Now she admitted that she need to see a therapist.  We will schedule appointment with a counselor in this office.  I recommended not to take someone else medication and we will adjust her medication on her next appointment.  We will also see if she can get early appointment to see this Probation officer.

## 2015-10-07 ENCOUNTER — Telehealth (HOSPITAL_COMMUNITY): Payer: Self-pay

## 2015-10-17 ENCOUNTER — Ambulatory Visit (HOSPITAL_COMMUNITY): Payer: Self-pay | Admitting: Clinical

## 2015-10-22 ENCOUNTER — Other Ambulatory Visit (HOSPITAL_COMMUNITY): Payer: Self-pay | Admitting: Psychiatry

## 2015-10-22 ENCOUNTER — Encounter: Payer: Self-pay | Admitting: Internal Medicine

## 2015-10-24 ENCOUNTER — Other Ambulatory Visit (HOSPITAL_COMMUNITY): Payer: Self-pay

## 2015-10-24 DIAGNOSIS — F331 Major depressive disorder, recurrent, moderate: Secondary | ICD-10-CM

## 2015-10-24 MED ORDER — LAMOTRIGINE 150 MG PO TABS: 150.0000 mg | ORAL_TABLET | Freq: Every day | ORAL | Status: DC

## 2015-10-24 MED FILL — lamoTRIgine 150 MG TABS: 150 | 90 days supply | Qty: 90 | Fill #0

## 2015-10-24 NOTE — Telephone Encounter (Signed)
Fax received for a refill on Lamotrigine. Patient was last seen on 11/2 and has a follow up on 5/3. Okay to refill for 90 days per insurance? Please advise, thank you

## 2015-10-24 NOTE — Telephone Encounter (Signed)
Per Dr. Lovena Le it is okay to send 90 day order to the pharmacy.

## 2015-10-30 MED FILL — ZOLPIDEM TART ER 12.5 MG TA: 12.5 | 30 days supply | Qty: 30 | Fill #2

## 2015-10-31 ENCOUNTER — Ambulatory Visit (HOSPITAL_COMMUNITY): Payer: Self-pay | Admitting: Clinical

## 2015-11-05 ENCOUNTER — Other Ambulatory Visit (HOSPITAL_COMMUNITY): Payer: Self-pay | Admitting: Psychiatry

## 2015-11-05 MED FILL — ATORVASTATIN 20 MG TABLET: 20 | 90 days supply | Qty: 45 | Fill #0

## 2015-11-05 MED FILL — TRIAMTERENE/HCTZ 37.5/25 TB: 37.5-25 | 90 days supply | Qty: 90 | Fill #0

## 2015-11-06 ENCOUNTER — Other Ambulatory Visit (HOSPITAL_COMMUNITY): Payer: Self-pay | Admitting: Psychiatry

## 2015-11-06 DIAGNOSIS — F331 Major depressive disorder, recurrent, moderate: Secondary | ICD-10-CM

## 2015-11-06 MED ORDER — VENLAFAXINE HCL ER 150 MG PO CP24: ORAL_CAPSULE | ORAL | Status: DC

## 2015-11-06 MED FILL — VENLAFAXINE HCL ER 150 MG C: 150 | 90 days supply | Qty: 180 | Fill #0

## 2015-11-12 ENCOUNTER — Encounter (HOSPITAL_COMMUNITY): Payer: Self-pay | Admitting: Psychiatry

## 2015-11-12 ENCOUNTER — Ambulatory Visit (INDEPENDENT_AMBULATORY_CARE_PROVIDER_SITE_OTHER): Payer: 59 | Admitting: Psychiatry

## 2015-11-12 VITALS — BP 112/68 | HR 92 | Ht 66.0 in | Wt 139.4 lb

## 2015-11-12 DIAGNOSIS — F411 Generalized anxiety disorder: Secondary | ICD-10-CM

## 2015-11-12 DIAGNOSIS — F410 Panic disorder [episodic paroxysmal anxiety] without agoraphobia: Secondary | ICD-10-CM | POA: Diagnosis not present

## 2015-11-12 DIAGNOSIS — F331 Major depressive disorder, recurrent, moderate: Secondary | ICD-10-CM

## 2015-11-12 MED ORDER — DULOXETINE HCL 20 MG PO CPEP: 20.0000 mg | ORAL_CAPSULE | Freq: Every day | ORAL | Status: DC

## 2015-11-12 MED ORDER — CLONAZEPAM 0.5 MG PO TABS: 0.5000 mg | ORAL_TABLET | Freq: Every day | ORAL | Status: DC

## 2015-11-12 MED FILL — clonazePAM 0.5 MG TABS: 0.5 | 30 days supply | Qty: 30 | Fill #0

## 2015-11-12 MED FILL — DULoxetine HCL 20 MG CPEP: 20 | 30 days supply | Qty: 30 | Fill #0

## 2015-11-12 NOTE — Progress Notes (Signed)
[place] [id number] Progress Note  NICHA ZORA [id number] 60 y.o.  11/12/2015 9:23 AM  Chief Complaint:  I don't think my medicine working.  I still feel very depressed.  I have no energy.           History of Present Illness:  Clarann came earlier than her scheduled appointment.  On her last visit we reduced Abilify because she was complaining of weight gain and restlessness.  Though her tremors and restlessness and weight gain is under control but she's been experiencing increased depression .  She endorsed lack of energy, lack of motivation, anhedonia, feeling hopelessness and having crying spells.  She endorse that she only sleeps and eat and she has no life.  She is taking care of her 38 year old mother.  She admitted some time feeling of worthlessness.  She endorsed fatigue and lack of attention and concentration.  She is taking Lamictal, low-dose Abilify, Effexor and Ambien .  Last month she took her husband Xanax as she had a panic attack.  Patient told it calm her down.  She sleeping okay .  She denies any paranoia, hallucination or any active suicidal thoughts.  She is going to work but she admitted more isolated and withdrawn.  She lives with her husband who is very supportive.  Patient denies drinking or using any illegal substances.  Her appetite is okay.  Her vitals are stable.  Suicidal Ideation: No Plan Formed: No Patient has means to carry out plan: No  Homicidal Ideation: No Plan Formed: No Patient has means to carry out plan: No  Review of Systems: Psychiatric: Agitation: No Hallucination: No Depressed Mood: Yes Insomnia: No Hypersomnia: No Altered Concentration: No Feels Worthless: Yes Grandiose Ideas: No Belief In Special Powers: No New/Increased Substance Abuse: No Compulsions: No  Neurologic: Headache: No Seizure: No Paresthesias: No  Past Medical History: GERD  Outpatient Encounter Prescriptions as of 11/12/2015  Medication Sig  . atorvastatin  (LIPITOR) 20 MG tablet   . clonazePAM (KLONOPIN) 0.5 MG tablet Take 1 tablet (0.5 mg total) by mouth at bedtime.  . diclofenac sodium (VOLTAREN) 1 % GEL Apply 2 g topically 4 (four) times daily.  . DULoxetine (CYMBALTA) 20 MG capsule Take 1 capsule (20 mg total) by mouth daily.  Marland Kitchen estrogen-methylTESTOSTERone (ESTRATEST) 1.25-2.5 MG per tablet   . lamoTRIgine (LAMICTAL) 150 MG tablet Take 1 tablet (150 mg total) by mouth daily.  . minocycline (MINOCIN,DYNACIN) 100 MG capsule   . triamterene-hydrochlorothiazide (MAXZIDE-25) 37.5-25 MG tablet   . venlafaxine XR (EFFEXOR-XR) 150 MG 24 hr capsule TAKE 2 CAPSULES BY MOUTH DAILY.  . [DISCONTINUED] ARIPiprazole (ABILIFY) 2 MG tablet Take 1 tablet (2 mg total) by mouth daily.  . [DISCONTINUED] HYDROcodone-acetaminophen (NORCO/VICODIN) 5-325 MG per tablet Take 1 tablet by mouth 2 (two) times daily as needed. for pain  . [DISCONTINUED] zolpidem (AMBIEN CR) 12.5 MG CR tablet Take 1 tablet (12.5 mg total) by mouth at bedtime as needed. for sleep   No facility-administered encounter medications on file as of 11/12/2015.    Past Psychiatric History/Hospitalization(s): Patient has one psychiatric hospitalization in 2007 feeling depressed, panic attack and unable to work.  She was taking care of her elderly mother.  She denies any history of suicidal intent, mania or any psychosis.  Patient has tried in the past Zoloft, Remeron, Wellbutrin, Prozac, Abilify Xanax and Risperdal. Anxiety: Yes Bipolar Disorder: No Depression: Yes Mania: No Psychosis: No Schizophrenia: No Personality Disorder: No Hospitalization for psychiatric illness: Yes History  of Electroconvulsive Shock Therapy: No Prior Suicide Attempts: No  Physical Exam: Constitutional:  BP 112/68 mmHg  Pulse 92  Ht 5\' 6"  (1.676 m)  Wt 139 lb 6.4 oz (63.231 kg)  BMI 22.51 kg/m2  General Appearance: alert, oriented, no acute distress and well nourished  Musculoskeletal: Strength & Muscle  Tone: within normal limits Gait & Station: normal Patient leans: N/A  Psychiatric Specialty Exam: Physical Exam  Review of Systems  Constitutional: Negative.  Negative for weight loss.  Musculoskeletal: Negative.   Skin: Negative for itching and rash.  Neurological: Negative for tremors and headaches.    Blood pressure 112/68, pulse 92, height 5\' 6"  (1.676 m), weight 139 lb 6.4 oz (63.231 kg).Body mass index is 22.51 kg/(m^2).  General Appearance: Casual  Eye Contact::  Fair  Speech:  Normal Rate  Volume:  Decreased  Mood:  Depressed and Dysphoric  Affect:  Constricted and Depressed  Thought Process:  Goal Directed  Orientation:  Full (Time, Place, and Person)  Thought Content:  WDL  Suicidal Thoughts:  No  Homicidal Thoughts:  No  Memory:  Immediate;   Fair Recent;   Good Remote;   Good  Judgement:  Good  Insight:  Good  Psychomotor Activity:  Normal  Concentration:  Fair  Recall:  Good  Fund of Knowledge:  Good  Language:  Good  Akathisia:  No  Handed:  Right  AIMS (if indicated):     Assets:  Communication Skills Desire for Improvement Financial Resources/Insurance Housing Physical Health Social Support  ADL's:  Intact  Cognition:  WNL  Sleep:       Established Problem, Stable/Improving (1), Review of Psycho-Social Stressors (1), Review and summation of old records (2), Established Problem, Worsening (2), Review of Last Therapy Session (1), Review of Medication Regimen & Side Effects (2) and Review of New Medication or Change in Dosage (2)  Assessment: Axis I: Generalized anxiety disorder; MDD recurrent, moderate, panic attacks  Axis II: Deferred  Axis III: GERD  Plan:  Patient is slowly decompensating.  She does not feel her current psychiatric medication is working.  I will discontinue Abilify and Ambien.  We will try low-dose Cymbalta 20 mg daily .  I strongly encouraged not to use someone's medication especially benzodiazepine.  Patient used her  husband's Xanax in the past.  I will try low-dose Klonopin to help anxiety , panic attack and insomnia.  Discussed benzodiazepine dependence, tolerance and withdrawal.  If Cymbalta works then we will consider increasing the dose on her next appointment and reducing Effexor.  Encouraged to keep appointment with Tharon Aquas.  Patient has no rash or itching.  Recommended to call us back if she has any question or any concern.  Follow-up in 4 weeks.Time spent 25 minutes.  More than 50% of the time spent in psychoeducation, counseling and coordination of care.  Discuss safety plan that anytime having active suicidal thoughts or homicidal thoughts then patient need to call 911 or go to the local emergency room.   Bensyn Bornemann T., MD 11/12/2015

## 2015-11-13 ENCOUNTER — Ambulatory Visit (HOSPITAL_COMMUNITY): Payer: Self-pay | Admitting: Clinical

## 2015-11-22 MED FILL — MINOCYCLINE 100 MG CAPSULE: 100 | 30 days supply | Qty: 60 | Fill #0

## 2015-11-26 ENCOUNTER — Ambulatory Visit (INDEPENDENT_AMBULATORY_CARE_PROVIDER_SITE_OTHER): Payer: 59 | Admitting: Clinical

## 2015-11-26 ENCOUNTER — Encounter (HOSPITAL_COMMUNITY): Payer: Self-pay | Admitting: Clinical

## 2015-11-26 DIAGNOSIS — F411 Generalized anxiety disorder: Secondary | ICD-10-CM

## 2015-11-26 DIAGNOSIS — F331 Major depressive disorder, recurrent, moderate: Secondary | ICD-10-CM
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
## 2015-12-04 ENCOUNTER — Ambulatory Visit (HOSPITAL_COMMUNITY): Payer: Self-pay | Admitting: Psychiatry

## 2015-12-10 MED FILL — ESTRADIOL 1 MG TABLET: 1 | 90 days supply | Qty: 90 | Fill #0

## 2015-12-12 ENCOUNTER — Encounter (HOSPITAL_COMMUNITY): Payer: Self-pay | Admitting: Psychiatry

## 2015-12-12 ENCOUNTER — Ambulatory Visit (INDEPENDENT_AMBULATORY_CARE_PROVIDER_SITE_OTHER): Payer: 59 | Admitting: Psychiatry

## 2015-12-12 VITALS — BP 122/70 | HR 92 | Ht 66.0 in | Wt 137.0 lb

## 2015-12-12 DIAGNOSIS — F331 Major depressive disorder, recurrent, moderate: Secondary | ICD-10-CM | POA: Diagnosis not present

## 2015-12-12 MED ORDER — DULOXETINE HCL 30 MG PO CPEP: 30.0000 mg | ORAL_CAPSULE | Freq: Every day | ORAL | Status: DC

## 2015-12-12 MED ORDER — VENLAFAXINE HCL ER 150 MG PO CP24: ORAL_CAPSULE | ORAL | Status: DC

## 2015-12-12 MED ORDER — LAMOTRIGINE 150 MG PO TABS: 150.0000 mg | ORAL_TABLET | Freq: Every day | ORAL | Status: DC

## 2015-12-12 MED ORDER — CLONAZEPAM 0.5 MG PO TABS: 0.5000 mg | ORAL_TABLET | Freq: Every day | ORAL | Status: DC

## 2015-12-12 MED FILL — clonazePAM 0.5 MG TABS: 0.5 | 30 days supply | Qty: 30 | Fill #0

## 2015-12-12 MED FILL — DULoxetine HCL 30 MG CPEP: 30 | 90 days supply | Qty: 90 | Fill #0

## 2015-12-12 NOTE — Progress Notes (Signed)
[facility] Progress Note  MEILANIE BENNINGTON [id number] 60 y.o.  12/12/2015 2:20 PM  Chief Complaint:  I am feeling better with the medication.  I have more energy.  I'm less anxious.            History of Present Illness:  Kayla [name] came for her follow-up appointment.  On her last visit she felt medicine is not working and we discontinued Ambien and Abilify.  She started on Cymbalta and Klonopin.  She see a huge improvement.  She is less anxious less depressed.  She sleeping better.  She denies any major panic attack.  She continues to take Effexor and does not want to cut down the dose at this time.  She is more social, active and pleasant.  She has more energy and she is able to do more things since medicine adjusted.  She sleeping good.  She is taking Lamictal, Effexor, Klonopin and Cymbalta.  She has no tremors, shakes or any EPS.  She denies any paranoia or any hallucination.  She denies any suicidal thoughts or homicidal thought.  Her appetite is okay.  Her vitals are stable. She lives with her husband who is very supportive.  Patient denies drinking or using any illegal substances.    Suicidal Ideation: No Plan Formed: No Patient has means to carry out plan: No  Homicidal Ideation: No Plan Formed: No Patient has means to carry out plan: No  Review of Systems: Psychiatric: Agitation: No Hallucination: No Depressed Mood: No Insomnia: No Hypersomnia: No Altered Concentration: No Feels Worthless: No Grandiose Ideas: No Belief In Special Powers: No New/Increased Substance Abuse: No Compulsions: No  Neurologic: Headache: No Seizure: No Paresthesias: No  Past Medical History: GERD  Outpatient Encounter Prescriptions as of 12/12/2015  Medication Sig  . atorvastatin (LIPITOR) 20 MG tablet   . clonazePAM (KLONOPIN) 0.5 MG tablet Take 1 tablet (0.5 mg total) by mouth at bedtime.  . diclofenac sodium (VOLTAREN) 1 % GEL Apply 2 g topically 4 (four) times daily.  .  DULoxetine (CYMBALTA) 30 MG capsule Take 1 capsule (30 mg total) by mouth daily.  Marland Kitchen estrogen-methylTESTOSTERone (ESTRATEST) 1.25-2.5 MG per tablet   . lamoTRIgine (LAMICTAL) 150 MG tablet Take 1 tablet (150 mg total) by mouth daily.  . minocycline (MINOCIN,DYNACIN) 100 MG capsule   . triamterene-hydrochlorothiazide (MAXZIDE-25) 37.5-25 MG tablet   . venlafaxine XR (EFFEXOR-XR) 150 MG 24 hr capsule TAKE 2 CAPSULES BY MOUTH DAILY.  . [DISCONTINUED] clonazePAM (KLONOPIN) 0.5 MG tablet Take 1 tablet (0.5 mg total) by mouth at bedtime.  . [DISCONTINUED] DULoxetine (CYMBALTA) 20 MG capsule Take 1 capsule (20 mg total) by mouth daily.  . [DISCONTINUED] lamoTRIgine (LAMICTAL) 150 MG tablet Take 1 tablet (150 mg total) by mouth daily.  . [DISCONTINUED] venlafaxine XR (EFFEXOR-XR) 150 MG 24 hr capsule TAKE 2 CAPSULES BY MOUTH DAILY.   No facility-administered encounter medications on file as of 12/12/2015.    Past Psychiatric History/Hospitalization(s): Patient has one psychiatric hospitalization in 2007 feeling depressed, panic attack and unable to work.  She was taking care of her elderly mother.  She denies any history of suicidal intent, mania or any psychosis.  Patient has tried in the past Zoloft, Remeron, Wellbutrin, Prozac, Abilify Xanax and Risperdal. Anxiety: Yes Bipolar Disorder: No Depression: Yes Mania: No Psychosis: No Schizophrenia: No Personality Disorder: No Hospitalization for psychiatric illness: Yes History of Electroconvulsive Shock Therapy: No Prior Suicide Attempts: No  Physical Exam: Constitutional:  BP 122/70 mmHg  Pulse  92  Ht 5\' 6"  (1.676 m)  Wt 137 lb (62.143 kg)  BMI 22.12 kg/m2  General Appearance: alert, oriented, no acute distress and well nourished  Musculoskeletal: Strength & Muscle Tone: within normal limits Gait & Station: normal Patient leans: N/A  Psychiatric Specialty Exam: Physical Exam  Review of Systems  Constitutional: Negative.  Negative  for weight loss.  Musculoskeletal: Negative.   Skin: Negative for itching and rash.  Neurological: Negative for tremors and headaches.    Blood pressure 122/70, pulse 92, height 5\' 6"  (1.676 m), weight 137 lb (62.143 kg).Body mass index is 22.12 kg/(m^2).  General Appearance: Casual  Eye Contact::  Fair  Speech:  Normal Rate  Volume:  Normal  Mood:  Euthymic  Affect:  Constricted and Depressed  Thought Process:  Goal Directed  Orientation:  Full (Time, Place, and Person)  Thought Content:  WDL  Suicidal Thoughts:  No  Homicidal Thoughts:  No  Memory:  Immediate;   Fair Recent;   Good Remote;   Good  Judgement:  Good  Insight:  Good  Psychomotor Activity:  Normal  Concentration:  Fair  Recall:  Good  Fund of Knowledge:  Good  Language:  Good  Akathisia:  No  Handed:  Right  AIMS (if indicated):     Assets:  Communication Skills Desire for Improvement Financial Resources/Insurance Housing Physical Health Social Support  ADL's:  Intact  Cognition:  WNL  Sleep:       Established Problem, Stable/Improving (1), Review of Psycho-Social Stressors (1), Review of Last Therapy Session (1) and Review of Medication Regimen & Side Effects (2)  Assessment: Axis I: Generalized anxiety disorder; MDD recurrent, moderate, panic attacks  Axis II: Deferred  Axis III: GERD  Plan:  Patient doing better on her current medication.  I recommended to try Cymbalta 30 mg to help her residual anxiety.  Patient at this time does not want to come off from Effexor however I recommended if she started to have any side effects including any tremors or shakes then we will consider lowering Effexor.  Continue Effexor 300 mg daily, Lamictal 150 mg daily, Klonopin 0.5 mg at bedtime.  Patient has no rash or itching.  She is seeing Tharon Aquas for counseling.  Encouraged to keep appointment.  Follow-up in 3 months. Discuss safety plan that anytime having active suicidal thoughts or homicidal thoughts then  patient need to call 911 or go to the local emergency room.   Neomi Laidler T., MD 12/12/2015

## 2015-12-18 ENCOUNTER — Ambulatory Visit (INDEPENDENT_AMBULATORY_CARE_PROVIDER_SITE_OTHER): Payer: 59 | Admitting: Clinical

## 2015-12-18 ENCOUNTER — Encounter (HOSPITAL_COMMUNITY): Payer: Self-pay | Admitting: Clinical

## 2015-12-18 DIAGNOSIS — F411 Generalized anxiety disorder: Secondary | ICD-10-CM

## 2015-12-18 DIAGNOSIS — F331 Major depressive disorder, recurrent, moderate: Secondary | ICD-10-CM

## 2015-12-18 NOTE — Progress Notes (Signed)
   THERAPIST PROGRESS NOTE  Session Time: 3:30 -4:25  Participation Level: Active  Behavioral Response: CasualAlertDepressed  Type of Therapy: Individual Therapy  Treatment Goals addressed: improve psychiatric symptoms,  Improve unhelpful thought patterns,  decrease anxiety (decreased guilt and internal anger)   Interventions: psycho education, CBT, Motivational Interviewing, Grounding  techniques  Summary: [name] is a 60 y.o. female who presents with Major depressive disorder, recurrent episode, moderate and Generalized Anxiety Disorder  Suicidal/Homicidal: Nowithout intent/plan  Therapist Response: Lunah met with clinician for an individual session. [name] discussed her psychiatric symptoms, her current life events, and her goals for therapy. [name] shared that she has been experiencing guilt and internal anger, which she feels increases her anxiety and depression. 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 She described her relationship and her negative thoughts. Clinician introduced some basic cbt concepts. Client and clinician discussed how our thoughts affect our emotions and experiences. [name] shared that she would like to learn to shift her thoughts. Clinician printed a homework packet on Depression. [name] agreed to complete it before next session and bring it back with her. Clinician also introduced a grounding technique. Client and clinician discussed the process of interrupting negative thought cycles. Client and clinician practiced the technique together and [name] agreed to practice it daily until next session.   Plan: Return again in 1-2 weeks.  Diagnosis: Axis I: Major depressive disorder, recurrent episode, moderate and Generalized Anxiety Disorder       Pollie Poma A, LCSW 12/18/2015

## 2016-01-13 ENCOUNTER — Encounter (HOSPITAL_COMMUNITY): Payer: Self-pay | Admitting: Clinical

## 2016-01-13 ENCOUNTER — Ambulatory Visit (INDEPENDENT_AMBULATORY_CARE_PROVIDER_SITE_OTHER): Payer: 59 | Admitting: Clinical

## 2016-01-13 DIAGNOSIS — F411 Generalized anxiety disorder: Secondary | ICD-10-CM

## 2016-01-13 DIAGNOSIS — F331 Major depressive disorder, recurrent, moderate: Secondary | ICD-10-CM

## 2016-01-13 MED FILL — clonazePAM 0.5 MG TABS: 0.5 | 30 days supply | Qty: 30 | Fill #1

## 2016-01-13 NOTE — Progress Notes (Addendum)
   THERAPIST PROGRESS NOTE  Session Time: 1:30 -2:28  Participation Level: Active  Behavioral Response: NeatAlertDepressed   Type of Therapy: Individual Therapy  Treatment Goals addressed: improve psychiatric symptoms, elevate mood (evidenced by decreased irritability) Improve unhelpful thought patterns, stress management (stress tolerance skills and stress reduction skills) decrease anxiety (decreased internal anger), and learn about diagnosis, healthy coping skill.   Interventions: psycho education, CBT, Motivational Interviewing, Grounding and mindfulness techniques  Summary: Kayla [name] is [name] 60 y.o. female who presents with Major depressive disorder, recurrent episode, moderate and Generalized Anxiety Disorder  Suicidal/Homicidal: No without intent/plan  Therapist Response: Vaughan Basta met with clinician for an individual session. Kayla [name] discussed her psychiatric symptoms, her current life events, and her homework. Kayla [name] shared that she completed her homework. Client and clinician reviewed and discussed her homework. Clinician gave her depression packet #3 which she agreed to complete and bring back with her. Kayla [name] shared that she went to the beach with her husband and was filled with guilt, making her depressed and unable to enjoy her time away. She shared that her guilt overwhelms her when she is separate from her mother, even though she checks in with her and has others check on her. She also feels guilt about "doing enough." Kayla [name] shared her negative automatic thoughts (about herself). Clinician introduced [name] 7 panel thought record sheet. Clinician asked open ended questions and Kayla [name] identified her emotions and negative automatic thoughts. Client and clinician discussed the evidence for and against the thoughts. Kayla [name] had the insight that her anxiety increases with her belief that she is responsible for other's happiness and that she is not allowed to be happy until others are happy. Client and  clinician agreed to discuss thihs further at [name] future session.Kayla [name] agreed to do her homework packet and to practice her mindfulness and grounding techniques.  Plan: Return again in 1-2 weeks.  Diagnosis: Axis I: Major depressive disorder, recurrent episode, moderate and Generalized Anxiety Disorder   Kayla Schulenburg A, LCSW 01/13/2016

## 2016-01-22 MED FILL — MINOCYCLINE 100 MG CAPSULE: 100 | 30 days supply | Qty: 60 | Fill #1

## 2016-01-22 MED FILL — ATORVASTATIN 20 MG TABLET: 20 | 90 days supply | Qty: 45 | Fill #1

## 2016-01-22 MED FILL — lamoTRIgine 150 MG TABS: 150 | 90 days supply | Qty: 90 | Fill #0

## 2016-02-03 MED FILL — VENLAFAXINE HCL ER 150 MG C: 150 | 90 days supply | Qty: 180 | Fill #0

## 2016-02-03 MED FILL — TRIAMTERENE/HCTZ 37.5/25 TB: 37.5-25 | 30 days supply | Qty: 30 | Fill #0

## 2016-02-10 MED FILL — clonazePAM 0.5 MG TABS: 0.5 | 30 days supply | Qty: 30 | Fill #2

## 2016-02-11 ENCOUNTER — Encounter (HOSPITAL_COMMUNITY): Payer: Self-pay | Admitting: Clinical

## 2016-02-11 ENCOUNTER — Ambulatory Visit (INDEPENDENT_AMBULATORY_CARE_PROVIDER_SITE_OTHER): Payer: 59 | Admitting: Clinical

## 2016-02-11 DIAGNOSIS — F331 Major depressive disorder, recurrent, moderate: Secondary | ICD-10-CM | POA: Diagnosis not present

## 2016-02-11 DIAGNOSIS — F411 Generalized anxiety disorder: Secondary | ICD-10-CM

## 2016-02-11 NOTE — Progress Notes (Signed)
THERAPIST PROGRESS NOTE  Session Time: 1:30 - 2:25   Participation Level: Active  Behavioral Response: CasualAlertDepressed  Type of Therapy: Individual Therapy  Treatment Goals addressed: improve psychiatric symptoms, elevate mood (evidenced by decreased irritability and increased self-esteem, and enjoyment.) Improve unhelpful thought patterns, stress management (stress tolerance skills and stress reduction skills) decrease anxiety (decreased guilt and internal anger), and learn about diagnosis, healthy coping skill.   Interventions: psycho education, CBT, Motivational Interviewing, Grounding and mindfulness techniques  Summary: Kayla [name] is a 60 y.o. female who presents with Major depressive disorder, recurrent episode, moderate and Generalized Anxiety Disorder  Suicidal/Homicidal: No without intent/plan  Therapist Response: Vaughan Basta met with clinician for an individual session. Derisha discussed her psychiatric symptoms, her current life events, and her homework. Amanii shared that she had not completed her homework packet but agreed to complete it before next session and bring it with her. Shineka shared that she had been feeling a lot of depression, guilt and conflict about her mother. Vaughan Basta and clinician discussed her thoughts and emotions. Client and clinician used a 7 panel thought record sheet to explore the evidence for and against her negative automatic thoughts. Morene was then able to formulate healthier alternative thoughts. Client and clinician discussed Elisse's thoughts and insights. Shakira shared that she felt guilty having fun when she is away from her mother. Client and clinician discussed her negative thoughts. Maloree had the insight that whether she was having fun or misrable away from her mother did not change anything but her own experience. Ellyanna shared she practiced her grounding and mindfulness techniques sometimes and found they helped when she did, She agreed to tey to be  more consistent in her practice.   Plan: Return again in 1-2 weeks.  Diagnosis: Axis I: Major depressive disorder, recurrent episode, moderate and Generalized Anxiety Disorder   Darely Becknell A, LCSW 02/11/2016

## 2016-03-09 ENCOUNTER — Other Ambulatory Visit (HOSPITAL_COMMUNITY): Payer: Self-pay

## 2016-03-09 ENCOUNTER — Other Ambulatory Visit (HOSPITAL_COMMUNITY): Payer: Self-pay | Admitting: Psychiatry

## 2016-03-09 DIAGNOSIS — F331 Major depressive disorder, recurrent, moderate: Secondary | ICD-10-CM

## 2016-03-09 MED ORDER — CLONAZEPAM 0.5 MG PO TABS: 0.5000 mg | ORAL_TABLET | Freq: Every day | ORAL | 0 refills | Status: DC

## 2016-03-09 MED FILL — clonazePAM 0.5 MG TABS: 0.5 | 30 days supply | Qty: 30 | Fill #0

## 2016-03-09 MED FILL — TRIAMTERENE-HCTZ 37.5-25 MG: 37.5-25 | 30 days supply | Qty: 30 | Fill #1

## 2016-03-10 MED FILL — ESTRADIOL 1 MG TABLET: 1 | 90 days supply | Qty: 90 | Fill #0

## 2016-03-11 ENCOUNTER — Other Ambulatory Visit (HOSPITAL_COMMUNITY): Payer: Self-pay

## 2016-03-11 DIAGNOSIS — F331 Major depressive disorder, recurrent, moderate: Secondary | ICD-10-CM

## 2016-03-11 MED ORDER — DULOXETINE HCL 30 MG PO CPEP: 30.0000 mg | ORAL_CAPSULE | Freq: Every day | ORAL | 0 refills | Status: DC

## 2016-03-11 MED FILL — DULoxetine HCL 30 MG CPEP: 30 | 30 days supply | Qty: 30 | Fill #0

## 2016-03-16 ENCOUNTER — Ambulatory Visit (HOSPITAL_COMMUNITY): Payer: Self-pay | Admitting: Psychiatry

## 2016-03-24 ENCOUNTER — Ambulatory Visit (INDEPENDENT_AMBULATORY_CARE_PROVIDER_SITE_OTHER): Payer: 59 | Admitting: Clinical

## 2016-03-24 DIAGNOSIS — F331 Major depressive disorder, recurrent, moderate: Secondary | ICD-10-CM

## 2016-03-24 DIAGNOSIS — F411 Generalized anxiety disorder: Secondary | ICD-10-CM

## 2016-03-24 NOTE — Progress Notes (Signed)
   THERAPIST PROGRESS NOTE  Session Time: 3:31 - 4:28   Participation Level: Active  Behavioral Response: CasualAlertAnxious and Depressed  Type of Therapy: Individual Therapy  Treatment Goals addressed: improve psychiatric symptoms, elevate mood (evidenced by decreased irritability and increased self-esteem, and enjoyment.) Improve unhelpful thought patterns, stress management (stress tolerance skills and stress reduction skills) decrease anxiety (decreased guilt and internal anger), and learn about diagnosis, healthy coping skill.   Interventions: psycho education, CBT, Motivational Interviewing,  mindfulness techniques  Summary: ARACELLI WOLOSZYN is a 60 y.o. female who presents with Major depressive disorder, recurrent episode, moderate and Generalized Anxiety Disorder  Suicidal/Homicidal: No without intent/plan  Therapist Response: Vaughan Basta met with clinician for an individual session. Jakhiya discussed her psychiatric symptoms, her current life events, and her homework. Arnisha shared that she continues to experience depression and anxiety. Vaughan Basta and clinician reviewed and discussed her homework. Kailey and clinician discussed unhelpful thinking styles, including the ones that cause her the most difficulty. Client and clinician discussed how to challenge and change the unhelpful thoughts.  Desiray shared that she continues to have a lot of anxiety about her relationship with her mother. Client and clinician discussed how her unhelpful thinking styles affect her perception of their relationship and her behaviors in the relationship. Client and clinician discussed some of her negative thoughts. Client and clinician discussed the evidence for an against the negative thoughts. Gittel was then able to formulate healthier alternative thoughts. Client and clinician discussed being mindful (Present in the moment) . Danniel agreed to complete packet 6 depression and to continue practicing her grounding and mindfulness  techniques  Plan: Return again in 1-2 weeks.  Diagnosis: Axis I: Major depressive disorder, recurrent episode, moderate and Generalized Anxiety Disorder    Draxton Luu A, LCSW 03/24/2016

## 2016-03-27 MED FILL — MINOCYCLINE 100 MG CAPSULE: 100 | 30 days supply | Qty: 60 | Fill #2

## 2016-03-31 ENCOUNTER — Ambulatory Visit (HOSPITAL_COMMUNITY): Payer: Self-pay | Admitting: Psychiatry

## 2016-04-09 ENCOUNTER — Other Ambulatory Visit (HOSPITAL_COMMUNITY): Payer: Self-pay | Admitting: Psychiatry

## 2016-04-09 DIAGNOSIS — F331 Major depressive disorder, recurrent, moderate: Secondary | ICD-10-CM

## 2016-04-09 MED FILL — TRIAMTERENE-HCTZ 37.5-25 MG: 37.5-25 | 30 days supply | Qty: 30 | Fill #2

## 2016-04-10 ENCOUNTER — Other Ambulatory Visit (HOSPITAL_COMMUNITY): Payer: Self-pay

## 2016-04-10 DIAGNOSIS — F331 Major depressive disorder, recurrent, moderate: Secondary | ICD-10-CM

## 2016-04-10 MED ORDER — CLONAZEPAM 0.5 MG PO TABS: 0.5000 mg | ORAL_TABLET | Freq: Every day | ORAL | 0 refills | Status: DC

## 2016-04-10 MED FILL — clonazePAM 0.5 MG TABS: 0.5 | 30 days supply | Qty: 30 | Fill #0

## 2016-04-13 ENCOUNTER — Other Ambulatory Visit (HOSPITAL_COMMUNITY): Payer: Self-pay

## 2016-04-13 DIAGNOSIS — F331 Major depressive disorder, recurrent, moderate: Secondary | ICD-10-CM

## 2016-04-13 MED ORDER — DULOXETINE HCL 30 MG PO CPEP: 30.0000 mg | ORAL_CAPSULE | Freq: Every day | ORAL | 1 refills | Status: DC

## 2016-04-13 MED FILL — DULoxetine HCL 30 MG CPEP: 30 | 30 days supply | Qty: 30 | Fill #0

## 2016-04-15 ENCOUNTER — Ambulatory Visit (HOSPITAL_COMMUNITY): Payer: Self-pay | Admitting: Clinical

## 2016-04-24 ENCOUNTER — Other Ambulatory Visit (HOSPITAL_COMMUNITY): Payer: Self-pay | Admitting: Psychiatry

## 2016-04-24 DIAGNOSIS — F331 Major depressive disorder, recurrent, moderate: Secondary | ICD-10-CM

## 2016-04-27 MED FILL — lamoTRIgine 150 MG TABS: 150 | 30 days supply | Qty: 30 | Fill #0

## 2016-04-28 MED FILL — ATORVASTATIN 20 MG TABLET: 20 | 90 days supply | Qty: 45 | Fill #2

## 2016-05-08 ENCOUNTER — Other Ambulatory Visit (HOSPITAL_COMMUNITY): Payer: Self-pay | Admitting: Psychiatry

## 2016-05-08 DIAGNOSIS — F331 Major depressive disorder, recurrent, moderate: Secondary | ICD-10-CM

## 2016-05-11 ENCOUNTER — Other Ambulatory Visit (HOSPITAL_COMMUNITY): Payer: Self-pay | Admitting: Psychiatry

## 2016-05-11 DIAGNOSIS — F331 Major depressive disorder, recurrent, moderate: Secondary | ICD-10-CM

## 2016-05-11 MED FILL — TRIAMTERENE-HCTZ 37.5-25 MG: 37.5-25 | 30 days supply | Qty: 30 | Fill #0

## 2016-05-11 MED FILL — DULoxetine HCL 30 MG CPEP: 30 | 30 days supply | Qty: 30 | Fill #1

## 2016-05-11 MED FILL — VENLAFAXINE HCL ER 150 MG C: 150 | 30 days supply | Qty: 60 | Fill #0

## 2016-05-13 ENCOUNTER — Other Ambulatory Visit (HOSPITAL_COMMUNITY): Payer: Self-pay

## 2016-05-13 DIAGNOSIS — F331 Major depressive disorder, recurrent, moderate: Secondary | ICD-10-CM

## 2016-05-13 MED ORDER — CLONAZEPAM 0.5 MG PO TABS: 0.5000 mg | ORAL_TABLET | Freq: Every day | ORAL | 0 refills | Status: DC

## 2016-05-13 MED FILL — clonazePAM 0.5 MG TABS: 0.5 | 30 days supply | Qty: 30 | Fill #0

## 2016-05-19 ENCOUNTER — Ambulatory Visit (HOSPITAL_COMMUNITY): Payer: Self-pay | Admitting: Psychiatry

## 2016-05-26 ENCOUNTER — Other Ambulatory Visit (HOSPITAL_COMMUNITY): Payer: Self-pay | Admitting: Psychiatry

## 2016-05-26 DIAGNOSIS — F331 Major depressive disorder, recurrent, moderate: Secondary | ICD-10-CM

## 2016-06-03 ENCOUNTER — Encounter (HOSPITAL_COMMUNITY): Payer: Self-pay | Admitting: Psychiatry

## 2016-06-03 ENCOUNTER — Ambulatory Visit (INDEPENDENT_AMBULATORY_CARE_PROVIDER_SITE_OTHER): Payer: 59 | Admitting: Psychiatry

## 2016-06-03 DIAGNOSIS — F4310 Post-traumatic stress disorder, unspecified: Secondary | ICD-10-CM

## 2016-06-03 DIAGNOSIS — F331 Major depressive disorder, recurrent, moderate: Secondary | ICD-10-CM

## 2016-06-03 DIAGNOSIS — F411 Generalized anxiety disorder: Secondary | ICD-10-CM

## 2016-06-03 MED ORDER — VENLAFAXINE HCL ER 150 MG PO CP24: 300.0000 mg | ORAL_CAPSULE | Freq: Every day | ORAL | 2 refills | Status: DC

## 2016-06-03 MED ORDER — DULOXETINE HCL 30 MG PO CPEP: 30.0000 mg | ORAL_CAPSULE | Freq: Every day | ORAL | 2 refills | Status: DC

## 2016-06-03 MED ORDER — LAMOTRIGINE 150 MG PO TABS: 150.0000 mg | ORAL_TABLET | Freq: Every day | ORAL | 2 refills | Status: DC

## 2016-06-03 MED ORDER — CLONAZEPAM 0.5 MG PO TABS: 0.5000 mg | ORAL_TABLET | Freq: Every day | ORAL | 2 refills | Status: DC

## 2016-06-03 MED FILL — lamoTRIgine 150 MG TABS: 150 | 30 days supply | Qty: 30 | Fill #0

## 2016-06-03 NOTE — Progress Notes (Signed)
[facility] Progress Note  Kayla [name] [id number] 60 y.o.  06/03/2016 2:34 PM  Chief Complaint:  My mother is in nursing home.  Sometime I feel guilty but I think she is in the right place.              History of Present Illness:  Kayla [name] came for her follow-up appointment.  She is compliant with the medication and reported no side effects.  She mentioned in summer she went to [place] with her husband and she usually calls her mother but got very worried when she did not become the phone.  She called the police officer coping for a well check visit but she did not receive any call back from the police.  She decided to come back to see her mother and found that she was on the floor.  She was taken to the emergency room and now she is in nursing home and making improvement.  Initially she was feeling guilty but now she sees that her mother is doing very well and able to make a few friends in nursing home she is much relief.  She is taking her medication as prescribed.  She sleeping good.  She denies any irritability, anger, paranoia or any panic attacks.  She denies any rash or itching with the Lamictal.  She has no tremors or shakes.  Her appetite is fair.  Her energy level is good.  Patient denies drinking alcohol or using any illegal substances.  Suicidal Ideation: No Plan Formed: No Patient has means to carry out plan: No  Homicidal Ideation: No Plan Formed: No Patient has means to carry out plan: No  Review of Systems: Psychiatric: Agitation: No Hallucination: No Depressed Mood: No Insomnia: No Hypersomnia: No Altered Concentration: No Feels Worthless: No Grandiose Ideas: No Belief In Special Powers: No New/Increased Substance Abuse: No Compulsions: No  Neurologic: Headache: No Seizure: No Paresthesias: No  Past Medical History: GERD  Outpatient Encounter Prescriptions as of 06/03/2016  Medication Sig Dispense Refill  . atorvastatin (LIPITOR) 20 MG tablet    2  . clonazePAM (KLONOPIN) 0.5 MG tablet Take 1 tablet (0.5 mg total) by mouth at bedtime. 30 tablet 2  . diclofenac sodium (VOLTAREN) 1 % GEL Apply 2 g topically 4 (four) times daily. 3 Tube 1  . DULoxetine (CYMBALTA) 30 MG capsule Take 1 capsule (30 mg total) by mouth daily. 30 capsule 2  . estrogen-methylTESTOSTERone (ESTRATEST) 1.25-2.5 MG per tablet     . lamoTRIgine (LAMICTAL) 150 MG tablet Take 1 tablet (150 mg total) by mouth daily. 30 tablet 2  . minocycline (MINOCIN,DYNACIN) 100 MG capsule   3  . triamterene-hydrochlorothiazide (MAXZIDE-25) 37.5-25 MG tablet   1  . venlafaxine XR (EFFEXOR-XR) 150 MG 24 hr capsule Take 2 capsules (300 mg total) by mouth daily with breakfast. 60 capsule 2  . [DISCONTINUED] clonazePAM (KLONOPIN) 0.5 MG tablet Take 1 tablet (0.5 mg total) by mouth at bedtime. 30 tablet 0  . [DISCONTINUED] DULoxetine (CYMBALTA) 30 MG capsule Take 1 capsule (30 mg total) by mouth daily. 30 capsule 1  . [DISCONTINUED] lamoTRIgine (LAMICTAL) 150 MG tablet TAKE 1 TABLET (150 MG TOTAL) BY MOUTH DAILY. 30 tablet 0  . [DISCONTINUED] lamoTRIgine (LAMICTAL) 150 MG tablet TAKE 1 TABLET (150 MG TOTAL) BY MOUTH DAILY. 30 tablet 0  . [DISCONTINUED] venlafaxine XR (EFFEXOR-XR) 150 MG 24 hr capsule TAKE 2 CAPSULES BY MOUTH DAILY. 60 capsule 0  . [DISCONTINUED] venlafaxine XR (EFFEXOR-XR) 150 MG  24 hr capsule TAKE 2 CAPSULES BY MOUTH DAILY. 60 capsule 0   No facility-administered encounter medications on file as of 06/03/2016.     Past Psychiatric History/Hospitalization(s): Patient has one psychiatric hospitalization in 2007 feeling depressed, panic attack and unable to work.  She was taking care of her elderly mother.  She denies any history of suicidal intent, mania or any psychosis.  Patient has tried in the past Zoloft, Remeron, Wellbutrin, Prozac, Abilify Xanax and Risperdal. Anxiety: Yes Bipolar Disorder: No Depression: Yes Mania: No Psychosis: No Schizophrenia: No Personality  Disorder: No Hospitalization for psychiatric illness: Yes History of Electroconvulsive Shock Therapy: No Prior Suicide Attempts: No  Physical Exam: Constitutional:  BP 108/64   Pulse 90   Ht 5\' 6"  (1.676 m)   Wt 136 lb (61.7 kg)   BMI 21.95 kg/m   General Appearance: alert, oriented, no acute distress and well nourished  Musculoskeletal: Strength & Muscle Tone: within normal limits Gait & Station: normal Patient leans: N/A  Psychiatric Specialty Exam: Physical Exam  Review of Systems  Constitutional: Negative.  Negative for weight loss.  Musculoskeletal: Negative.   Skin: Negative for itching and rash.  Neurological: Negative for tremors and headaches.    General Appearance: Casual  Eye Contact::  Fair  Speech:  Normal Rate  Volume:  Normal  Mood:  Euthymic  Affect:  Constricted and Depressed  Thought Process:  Goal Directed  Orientation:  Full (Time, Place, and Person)  Thought Content:  WDL  Suicidal Thoughts:  No  Homicidal Thoughts:  No  Memory:  Immediate;   Fair Recent;   Good Remote;   Good  Judgement:  Good  Insight:  Good  Psychomotor Activity:  Normal  Concentration:  Fair  Recall:  Good  Fund of Knowledge:  Good  Language:  Good  Akathisia:  No  Handed:  Right  AIMS (if indicated):     Assets:  Communication Skills Desire for Improvement Financial Resources/Insurance Housing Physical Health Social Support  ADL's:  Intact  Cognition:  WNL  Sleep:       Established Problem, Stable/Improving (1), Review of Psycho-Social Stressors (1), Review of Last Therapy Session (1) and Review of Medication Regimen & Side Effects (2)  Assessment: Axis I: Generalized anxiety disorder; MDD recurrent, moderate, panic attacks  Axis II: Deferred  Axis III: GERD  Plan:  Patient doing better on her current medication.  Reassurance given.  Continue to take Cymbalta 30 mg daily, Effexor one 50 mg 2 tablet in the morning,Lamictal 150 mg daily, Klonopin 0.5 mg  at bedtime.  Patient has no rash or itching.  She has no symptoms of serotonin syndrome.  She is seeing Tharon Aquas for counseling.  Encouraged to keep appointment.  Follow-up in 3 months. Discuss safety plan that anytime having active suicidal thoughts or homicidal thoughts then patient need to call 911 or go to the local emergency room.   ARFEEN,SYED T., MD 06/03/2016

## 2016-06-08 MED FILL — VENLAFAXINE HCL ER 150 MG C: 150 | 30 days supply | Qty: 60 | Fill #0

## 2016-06-08 MED FILL — ESTRADIOL 1 MG TABLET: 1 | 31 days supply | Qty: 31 | Fill #0

## 2016-06-08 MED FILL — TRIAMTERENE-HCTZ 37.5-25 MG: 37.5-25 | 30 days supply | Qty: 30 | Fill #1

## 2016-06-12 MED FILL — DULoxetine HCL 30 MG CPEP: 30 | 30 days supply | Qty: 30 | Fill #0

## 2016-06-16 MED FILL — clonazePAM 0.5 MG TABS: 0.5 | 30 days supply | Qty: 30 | Fill #0

## 2016-07-06 MED FILL — DULoxetine HCL 30 MG CPEP: 30 | 30 days supply | Qty: 30 | Fill #1

## 2016-07-06 MED FILL — VENLAFAXINE HCL ER 150 MG C: 150 | 30 days supply | Qty: 60 | Fill #1

## 2016-07-06 MED FILL — TRIAMTERENE-HCTZ 37.5-25 MG: 37.5-25 | 30 days supply | Qty: 30 | Fill #2

## 2016-07-06 MED FILL — lamoTRIgine 150 MG TABS: 150 | 30 days supply | Qty: 30 | Fill #0

## 2016-07-06 MED FILL — ESTRADIOL 1 MG TABLET: 1 | 31 days supply | Qty: 31 | Fill #1

## 2016-07-14 MED FILL — ATORVASTATIN 20 MG TABLET: 20 | 30 days supply | Qty: 15 | Fill #0

## 2016-07-14 MED FILL — clonazePAM 0.5 MG TABS: 0.5 | 30 days supply | Qty: 30 | Fill #1

## 2016-07-24 MED FILL — MINOCYCLINE 100 MG CAPSULE: 100 | 30 days supply | Qty: 60 | Fill #0

## 2016-08-12 MED FILL — TRIAMTERENE-HCTZ 37.5-25 MG: 37.5-25 | 30 days supply | Qty: 30 | Fill #3

## 2016-08-12 MED FILL — VENLAFAXINE HCL ER 150 MG C: 150 | 30 days supply | Qty: 60 | Fill #2

## 2016-08-12 MED FILL — lamoTRIgine 150 MG TABS: 150 | 30 days supply | Qty: 30 | Fill #1

## 2016-08-12 MED FILL — clonazePAM 0.5 MG TABS: 0.5 | 30 days supply | Qty: 30 | Fill #2

## 2016-08-12 MED FILL — ESTRADIOL 1 MG TABLET: 1 | 30 days supply | Qty: 30 | Fill #2

## 2016-08-12 MED FILL — DULoxetine HCL 30 MG CPEP: 30 | 30 days supply | Qty: 30 | Fill #2

## 2016-09-03 ENCOUNTER — Ambulatory Visit (HOSPITAL_COMMUNITY): Payer: Self-pay | Admitting: Psychiatry

## 2016-09-07 DIAGNOSIS — E7800 Pure hypercholesterolemia, unspecified: Secondary | ICD-10-CM | POA: Diagnosis not present

## 2016-09-07 DIAGNOSIS — Z780 Asymptomatic menopausal state: Secondary | ICD-10-CM | POA: Diagnosis not present

## 2016-09-07 DIAGNOSIS — Z0000 Encounter for general adult medical examination without abnormal findings: Secondary | ICD-10-CM | POA: Diagnosis not present

## 2016-09-08 ENCOUNTER — Other Ambulatory Visit (HOSPITAL_COMMUNITY): Payer: Self-pay

## 2016-09-08 ENCOUNTER — Encounter (HOSPITAL_COMMUNITY): Payer: Self-pay | Admitting: Psychiatry

## 2016-09-08 ENCOUNTER — Ambulatory Visit (INDEPENDENT_AMBULATORY_CARE_PROVIDER_SITE_OTHER): Payer: 59 | Admitting: Psychiatry

## 2016-09-08 DIAGNOSIS — Z810 Family history of intellectual disabilities: Secondary | ICD-10-CM

## 2016-09-08 DIAGNOSIS — F17210 Nicotine dependence, cigarettes, uncomplicated: Secondary | ICD-10-CM

## 2016-09-08 DIAGNOSIS — F331 Major depressive disorder, recurrent, moderate: Secondary | ICD-10-CM

## 2016-09-08 DIAGNOSIS — Z811 Family history of alcohol abuse and dependence: Secondary | ICD-10-CM

## 2016-09-08 DIAGNOSIS — Z818 Family history of other mental and behavioral disorders: Secondary | ICD-10-CM

## 2016-09-08 DIAGNOSIS — Z79899 Other long term (current) drug therapy: Secondary | ICD-10-CM

## 2016-09-08 MED ORDER — VENLAFAXINE HCL ER 150 MG PO CP24: 300.0000 mg | ORAL_CAPSULE | Freq: Every day | ORAL | 0 refills | Status: DC

## 2016-09-08 MED ORDER — CLONAZEPAM 0.5 MG PO TABS: 0.5000 mg | ORAL_TABLET | Freq: Every day | ORAL | 0 refills | Status: DC

## 2016-09-08 MED ORDER — LAMOTRIGINE 150 MG PO TABS: 150.0000 mg | ORAL_TABLET | Freq: Every day | ORAL | 0 refills | Status: DC

## 2016-09-08 MED ORDER — DULOXETINE HCL 30 MG PO CPEP: 30.0000 mg | ORAL_CAPSULE | Freq: Every day | ORAL | 0 refills | Status: DC

## 2016-09-08 NOTE — Progress Notes (Signed)
[facility] MD/PA/NP OP Progress Note  09/08/2016 2:54 PM Kayla [name]  MRN:  [id number]  Chief Complaint:  Chief Complaint    Follow-up     Subjective:  I'm doing good on her medication.  I see my mother every day.  HPI: Kayla [name] came for her follow-up appointment.  She is taking her medication and reported no side effects.  She had a good Christmas.  She denies any irritability, anger, mania or any psychosis.  She still have guilty because her mother is in nursing home who does not want to stay there.  She visit her every day.  She has noticed that her mother had made few friends and actually physically doing much better.  Patient feels that current medicine is working very well for her mood and depression.  She denies any major panic attack since the last visit.  She wants to continue Lamictal and Klonopin.  She has no rash, itching or any side effects.  Her sleep is good.  Energy level is fair.  Her appetite is okay and her vital signs are stable.  Visit Diagnosis:    ICD-9-CM ICD-10-CM   1. Major depressive disorder, recurrent episode, moderate (HCC) 296.32 F33.1 clonazePAM (KLONOPIN) 0.5 MG tablet    Past Psychiatric History: Patient has one psychiatric hospitalization in 2007 when she was feeling depressed and having panic attack and unable to work.  She denies any history of suicidal attempt, mania, psychosis.  In the past she had tried Zoloft, Remeron, Wellbutrin, Prozac, Abilify, Xanax and Risperdal.  Past Medical History:  Past Medical History:  Diagnosis Date  . Anxiety   . Depression   . GERD (gastroesophageal reflux disease)     Past Surgical History:  Procedure Laterality Date  . ABDOMINAL HYSTERECTOMY    . APPENDECTOMY    . BACK SURGERY      Family Psychiatric History: Reviewed.  Family History:  Family History  Problem Relation Age of Onset  . Depression Mother   . Alcohol abuse Father   . Dementia Maternal Uncle   . Depression Brother     Social History:  Social  History   Social History  . Marital status: Married    Spouse name: N/A  . Number of children: N/A  . Years of education: N/A   Social History Main Topics  . Smoking status: Current Every Day Smoker    Packs/day: 0.50    Years: 30.00    Types: Cigarettes  . Smokeless tobacco: Never Used  . Alcohol use No  . Drug use: No  . Sexual activity: Yes   Other Topics Concern  . None   Social History Narrative  . None    Allergies: No Known Allergies  Metabolic Disorder Labs: No results found for: HGBA1C, MPG No results found for: PROLACTIN No results found for: CHOL, TRIG, HDL, CHOLHDL, VLDL, LDLCALC   Current Medications: Current Outpatient Prescriptions  Medication Sig Dispense Refill  . atorvastatin (LIPITOR) 20 MG tablet   2  . clonazePAM (KLONOPIN) 0.5 MG tablet Take 1 tablet (0.5 mg total) by mouth at bedtime. 90 tablet 0  . diclofenac sodium (VOLTAREN) 1 % GEL Apply 2 g topically 4 (four) times daily. 3 Tube 1  . DULoxetine (CYMBALTA) 30 MG capsule Take 1 capsule (30 mg total) by mouth daily. 90 capsule 0  . estrogen-methylTESTOSTERone (ESTRATEST) 1.25-2.5 MG per tablet     . lamoTRIgine (LAMICTAL) 150 MG tablet Take 1 tablet (150 mg total) by mouth daily. 90 tablet  0  . minocycline (MINOCIN,DYNACIN) 100 MG capsule   3  . triamterene-hydrochlorothiazide (MAXZIDE-25) 37.5-25 MG tablet   1  . venlafaxine XR (EFFEXOR-XR) 150 MG 24 hr capsule Take 2 capsules (300 mg total) by mouth daily with breakfast. 180 capsule 0   No current facility-administered medications for this visit.     Neurologic: Headache: No Seizure: No Paresthesias: No  Musculoskeletal: Strength & Muscle Tone: within normal limits Gait & Station: normal Patient leans: N/A  Psychiatric Specialty Exam: Review of Systems  Constitutional: Negative.   HENT: Negative.   Musculoskeletal: Negative.   Skin: Negative.  Negative for itching and rash.  Neurological: Negative.     Blood pressure 112/64,  pulse 84, height 5\' 6"  (1.676 m), weight 137 lb 9.6 oz (62.4 kg).Body mass index is 22.21 kg/m.  General Appearance: Casual  Eye Contact:  Good  Speech:  Clear and Coherent  Volume:  Normal  Mood:  Anxious  Affect:  Congruent  Thought Process:  Goal Directed  Orientation:  Full (Time, Place, and Person)  Thought Content: WDL and Logical   Suicidal Thoughts:  No  Homicidal Thoughts:  No  Memory:  Immediate;   Good Recent;   Good Remote;   Good  Judgement:  Good  Insight:  Good  Psychomotor Activity:  Normal  Concentration:  Concentration: Good and Attention Span: Good  Recall:  Good  Fund of Knowledge: Good  Language: Good  Akathisia:  No  Handed:  Right  AIMS (if indicated):  0  Assets:  Communication Skills Desire for Improvement Financial Resources/Insurance Housing Physical Health Resilience  ADL's:  Intact  Cognition: WNL  Sleep:  ok    Assessment: Generalized anxiety disorder.  Major depressive disorder, recurrent.  Panic attack  Plan: Patient is a stable on her current psychiatric medication.  Since she started seeing her mother every day she is more calm.  I will continue Effexor 150 mg 2 tablet daily, Lamictal 150 mg daily and Klonopin 0.5 mg at bedtime.  She like to continue her medication from [facility] for 90 days.  Discussed medication side effects and benefits.  Recommended to call us back if she has any question, concern if she feels worsening of the symptom.  Follow-up in 3 months.  ARFEEN,SYED T., MD 09/08/2016, 2:54 PM

## 2016-09-10 MED FILL — MINOCYCLINE 100 MG CAPSULE: 100 | 30 days supply | Qty: 60 | Fill #1

## 2016-10-06 ENCOUNTER — Encounter: Payer: Self-pay | Admitting: Internal Medicine

## 2016-11-01 ENCOUNTER — Other Ambulatory Visit (HOSPITAL_COMMUNITY): Payer: Self-pay | Admitting: Psychiatry

## 2016-11-01 DIAGNOSIS — F331 Major depressive disorder, recurrent, moderate: Secondary | ICD-10-CM

## 2016-11-04 ENCOUNTER — Other Ambulatory Visit (HOSPITAL_COMMUNITY): Payer: Self-pay

## 2016-11-04 DIAGNOSIS — F331 Major depressive disorder, recurrent, moderate: Secondary | ICD-10-CM

## 2016-11-04 MED ORDER — VENLAFAXINE HCL ER 150 MG PO CP24: 300.0000 mg | ORAL_CAPSULE | Freq: Every day | ORAL | 0 refills | Status: DC

## 2016-11-04 MED ORDER — LAMOTRIGINE 150 MG PO TABS
150.0000 mg | ORAL_TABLET | Freq: Every day | ORAL | 0 refills | Status: DC
Start: 2016-11-04 — End: 2016-12-08

## 2016-11-04 MED ORDER — DULOXETINE HCL 30 MG PO CPEP: 30.0000 mg | ORAL_CAPSULE | Freq: Every day | ORAL | 0 refills | Status: DC

## 2016-12-01 DIAGNOSIS — H5203 Hypermetropia, bilateral: Secondary | ICD-10-CM | POA: Diagnosis not present

## 2016-12-01 DIAGNOSIS — H52202 Unspecified astigmatism, left eye: Secondary | ICD-10-CM | POA: Diagnosis not present

## 2016-12-01 DIAGNOSIS — H524 Presbyopia: Secondary | ICD-10-CM | POA: Diagnosis not present

## 2016-12-08 ENCOUNTER — Ambulatory Visit (INDEPENDENT_AMBULATORY_CARE_PROVIDER_SITE_OTHER): Payer: 59 | Admitting: Psychiatry

## 2016-12-08 ENCOUNTER — Encounter (HOSPITAL_COMMUNITY): Payer: Self-pay | Admitting: Psychiatry

## 2016-12-08 DIAGNOSIS — F411 Generalized anxiety disorder: Secondary | ICD-10-CM | POA: Diagnosis not present

## 2016-12-08 DIAGNOSIS — F331 Major depressive disorder, recurrent, moderate: Secondary | ICD-10-CM

## 2016-12-08 DIAGNOSIS — F410 Panic disorder [episodic paroxysmal anxiety] without agoraphobia: Secondary | ICD-10-CM | POA: Diagnosis not present

## 2016-12-08 DIAGNOSIS — F17210 Nicotine dependence, cigarettes, uncomplicated: Secondary | ICD-10-CM

## 2016-12-08 DIAGNOSIS — Z818 Family history of other mental and behavioral disorders: Secondary | ICD-10-CM

## 2016-12-08 DIAGNOSIS — Z811 Family history of alcohol abuse and dependence: Secondary | ICD-10-CM

## 2016-12-08 MED ORDER — LAMOTRIGINE 150 MG PO TABS: 150.0000 mg | ORAL_TABLET | Freq: Every day | ORAL | 0 refills | Status: DC

## 2016-12-08 MED ORDER — VENLAFAXINE HCL ER 150 MG PO CP24: 300.0000 mg | ORAL_CAPSULE | Freq: Every day | ORAL | 0 refills | Status: DC

## 2016-12-08 MED ORDER — CLONAZEPAM 0.5 MG PO TABS: 0.5000 mg | ORAL_TABLET | Freq: Every day | ORAL | 0 refills | Status: DC

## 2016-12-08 MED ORDER — DULOXETINE HCL 30 MG PO CPEP: 30.0000 mg | ORAL_CAPSULE | Freq: Every day | ORAL | 0 refills | Status: DC

## 2016-12-08 NOTE — Progress Notes (Signed)
[facility] MD/PA/NP OP Progress Note  12/08/2016 3:15 PM Kayla [name]  MRN:  [id number]  Chief Complaint:  Chief Complaint    Follow-up     Subjective:  I'm doing good.  I'm sleeping much better.  HPI: Kayla [name] came for her follow-up appointment.  She is compliant with medication and denies any side effects.  She continues to visit her mother every day who is now in nursing home.  In the rating she had guilty but now she is feeling good because mother is more calm and relaxed in nursing home.  Patient denies any panic attack.  She denies any crying spells or any feeling of hopelessness or worthlessness.  She lives with her husband who is very supportive.  Recently her husband ruptured his disc and having a lot of pain.  Patient told she feels bad because he is a lot of pain and they could not go to [place] trip which was planned.  Patient like to continue Lamictal Klonopin and Effexor.  She has no tremors shakes or any EPS.  Her appetite is okay.  Her vital signs are stable.  Visit Diagnosis:    ICD-9-CM ICD-10-CM   1. Major depressive disorder, recurrent episode, moderate (HCC) 296.32 F33.1 venlafaxine XR (EFFEXOR-XR) 150 MG 24 hr capsule     lamoTRIgine (LAMICTAL) 150 MG tablet     DULoxetine (CYMBALTA) 30 MG capsule     clonazePAM (KLONOPIN) 0.5 MG tablet    Past Psychiatric History: Reviewed.  Patient has one psychiatric hospitalization in 2007 when she was feeling depressed and having panic attack and unable to work.  She denies any history of suicidal attempt, mania, psychosis.  In the past she had tried Zoloft, Remeron, Wellbutrin, Prozac, Abilify, Xanax and Risperdal.  Past Medical History:  Past Medical History:  Diagnosis Date  . Anxiety   . Depression   . GERD (gastroesophageal reflux disease)     Past Surgical History:  Procedure Laterality Date  . ABDOMINAL HYSTERECTOMY    . APPENDECTOMY    . BACK SURGERY      Family Psychiatric History: Reviewed.  Family History:  Family  History  Problem Relation Age of Onset  . Depression Mother   . Alcohol abuse Father   . Depression Brother   . Dementia Maternal Uncle     Social History:  Social History   Social History  . Marital status: Married    Spouse name: N/A  . Number of children: N/A  . Years of education: N/A   Social History Main Topics  . Smoking status: Current Every Day Smoker    Packs/day: 0.50    Years: 30.00    Types: Cigarettes  . Smokeless tobacco: Never Used  . Alcohol use No  . Drug use: No  . Sexual activity: Yes    Partners: Male    Birth control/ protection: Surgical   Other Topics Concern  . None   Social History Narrative  . None    Allergies: No Known Allergies  Metabolic Disorder Labs: No results found for: HGBA1C, MPG No results found for: PROLACTIN No results found for: CHOL, TRIG, HDL, CHOLHDL, VLDL, LDLCALC   Current Medications: Current Outpatient Prescriptions  Medication Sig Dispense Refill  . atorvastatin (LIPITOR) 20 MG tablet   2  . clonazePAM (KLONOPIN) 0.5 MG tablet Take 1 tablet (0.5 mg total) by mouth at bedtime. 90 tablet 0  . DULoxetine (CYMBALTA) 30 MG capsule Take 1 capsule (30 mg total) by mouth daily. 90 capsule  0  . estrogen-methylTESTOSTERone (ESTRATEST) 1.25-2.5 MG per tablet     . lamoTRIgine (LAMICTAL) 150 MG tablet Take 1 tablet (150 mg total) by mouth daily. 90 tablet 0  . minocycline (MINOCIN,DYNACIN) 100 MG capsule   3  . triamterene-hydrochlorothiazide (MAXZIDE-25) 37.5-25 MG tablet   1  . venlafaxine XR (EFFEXOR-XR) 150 MG 24 hr capsule Take 2 capsules (300 mg total) by mouth daily with breakfast. 180 capsule 0  . diclofenac sodium (VOLTAREN) 1 % GEL Apply 2 g topically 4 (four) times daily. (Patient not taking: Reported on 12/08/2016) 3 Tube 1   No current facility-administered medications for this visit.     Neurologic: Headache: No Seizure: No Paresthesias: No  Musculoskeletal: Strength & Muscle Tone: within normal  limits Gait & Station: normal Patient leans: N/A  Psychiatric Specialty Exam: ROS  Blood pressure 104/66, pulse 73, height 5\' 6"  (1.676 m), weight 135 lb (61.2 kg).Body mass index is 21.79 kg/m.  General Appearance: Casual  Eye Contact:  Good  Speech:  Clear and Coherent  Volume:  Normal  Mood:  Euthymic  Affect:  Appropriate and Congruent  Thought Process:  Goal Directed  Orientation:  Full (Time, Place, and Person)  Thought Content: WDL and Logical   Suicidal Thoughts:  No  Homicidal Thoughts:  No  Memory:  Immediate;   Good Recent;   Good Remote;   Good  Judgement:  Good  Insight:  Good  Psychomotor Activity:  Normal  Concentration:  Concentration: Good and Attention Span: Good  Recall:  Good  Fund of Knowledge: Good  Language: Good  Akathisia:  No  Handed:  Right  AIMS (if indicated):  0  Assets:  Communication Skills Desire for Improvement Housing Resilience Social Support Transportation  ADL's:  Intact  Cognition: WNL  Sleep:  good    Assessment: Major depressive disorder, recurrent.  Generalized anxiety disorder.  Panic attacks.  Plan: Patient is a stable on her current psychiatric medication.  She has no side effects.  She has no rash or itching.  Continue Lamictal 150 mg daily, Klonopin 0.5 mg at bedtime and Effexor 150 mg 2 tablet daily.  Discussed medication side effects and benefits.  Recommended to call us back if she has any question or any concern.  Follow-up in 3 months  Kayla Navis T., MD 12/08/2016, 3:15 PM

## 2017-02-11 DIAGNOSIS — Z01419 Encounter for gynecological examination (general) (routine) without abnormal findings: Secondary | ICD-10-CM | POA: Diagnosis not present

## 2017-03-10 ENCOUNTER — Ambulatory Visit (INDEPENDENT_AMBULATORY_CARE_PROVIDER_SITE_OTHER): Payer: 59 | Admitting: Psychiatry

## 2017-03-10 ENCOUNTER — Encounter (HOSPITAL_COMMUNITY): Payer: Self-pay | Admitting: Psychiatry

## 2017-03-10 DIAGNOSIS — F419 Anxiety disorder, unspecified: Secondary | ICD-10-CM

## 2017-03-10 DIAGNOSIS — F331 Major depressive disorder, recurrent, moderate: Secondary | ICD-10-CM

## 2017-03-10 DIAGNOSIS — Z811 Family history of alcohol abuse and dependence: Secondary | ICD-10-CM | POA: Diagnosis not present

## 2017-03-10 DIAGNOSIS — Z818 Family history of other mental and behavioral disorders: Secondary | ICD-10-CM

## 2017-03-10 DIAGNOSIS — Z810 Family history of intellectual disabilities: Secondary | ICD-10-CM | POA: Diagnosis not present

## 2017-03-10 DIAGNOSIS — F410 Panic disorder [episodic paroxysmal anxiety] without agoraphobia: Secondary | ICD-10-CM | POA: Diagnosis not present

## 2017-03-10 DIAGNOSIS — F17210 Nicotine dependence, cigarettes, uncomplicated: Secondary | ICD-10-CM

## 2017-03-10 DIAGNOSIS — R05 Cough: Secondary | ICD-10-CM

## 2017-03-10 MED ORDER — DULOXETINE HCL 30 MG PO CPEP: 30.0000 mg | ORAL_CAPSULE | Freq: Every day | ORAL | 0 refills | Status: DC

## 2017-03-10 MED ORDER — VENLAFAXINE HCL ER 150 MG PO CP24: 300.0000 mg | ORAL_CAPSULE | Freq: Every day | ORAL | 0 refills | Status: DC

## 2017-03-10 MED ORDER — CLONAZEPAM 0.5 MG PO TABS: 0.5000 mg | ORAL_TABLET | Freq: Every day | ORAL | 0 refills | Status: DC

## 2017-03-10 MED ORDER — LAMOTRIGINE 150 MG PO TABS: 150.0000 mg | ORAL_TABLET | Freq: Every day | ORAL | 0 refills | Status: DC

## 2017-03-10 NOTE — Progress Notes (Signed)
[facility] MD/PA/NP OP Progress Note  03/10/2017 1:19 PM Kayla [name]  MRN:  [id number]  Chief Complaint:  Subjective:  Mother has pneumonia and she is dehydrated.  I am also having cough but getting better.  HPI: Kayla [name] came for her follow-up appointment.  She is taking the medication as prescribed and denies any side effects.  Her mother is sick and having pneumonia and getting treatment at nursing home.  Patient also had upper respiratory infection and having cough but slowly and gradually getting better.  She has been unable to go to [place] with her husband in recent months because her husband has lot of back problem and he ruptured his desk.  He is unable to work and is Dr. recommended to apply for disability.  However her husband works as a Airline pilot and he is not eligible for disability until age 28.  Patient admitted sometime that worries him but she is trying to be as supportive as she can to him.  She sleeping good.  She denies any major panic attack in recent months.  She denies any feeling of hopelessness or worthlessness.  She denies any crying spells or any suicidal thoughts.  She denies any side effects including tremors, shakes, rash or any itching.  She is taking Klonopin which is helping her sleep.  Her appetite is okay.  Her vital signs are stable.  Patient denies taking alcohol or using any illegal substances.  Her energy level is okay.  Visit Diagnosis:    ICD-10-CM   1. Major depressive disorder, recurrent episode, moderate (HCC) F33.1 venlafaxine XR (EFFEXOR-XR) 150 MG 24 hr capsule    lamoTRIgine (LAMICTAL) 150 MG tablet    DULoxetine (CYMBALTA) 30 MG capsule    clonazePAM (KLONOPIN) 0.5 MG tablet    DISCONTINUED: clonazePAM (KLONOPIN) 0.5 MG tablet    Past Psychiatric History: Reviewed. Patient has one psychiatric hospitalization in 2007 when she was feeling depressed and having panic attack and unable to work. She denies any history of suicidal attempt, mania, psychosis. In the  past she had tried Zoloft, Remeron, Wellbutrin, Prozac, Abilify, Xanax and Risperdal.  Past Medical History:  Past Medical History:  Diagnosis Date  . Anxiety   . Depression   . GERD (gastroesophageal reflux disease)     Past Surgical History:  Procedure Laterality Date  . ABDOMINAL HYSTERECTOMY    . APPENDECTOMY    . BACK SURGERY      Family Psychiatric History: Reviewed.  Family History:  Family History  Problem Relation Age of Onset  . Depression Mother   . Alcohol abuse Father   . Depression Brother   . Dementia Maternal Uncle     Social History:  Social History   Social History  . Marital status: Married    Spouse name: N/A  . Number of children: N/A  . Years of education: N/A   Social History Main Topics  . Smoking status: Current Every Day Smoker    Packs/day: 0.50    Years: 30.00    Types: Cigarettes  . Smokeless tobacco: Never Used  . Alcohol use No  . Drug use: No  . Sexual activity: Yes    Partners: Male    Birth control/ protection: Surgical   Other Topics Concern  . Not on file   Social History Narrative  . No narrative on file    Allergies: No Known Allergies  Metabolic Disorder Labs: No results found for: HGBA1C, MPG No results found for: PROLACTIN No results found for:  CHOL, TRIG, HDL, CHOLHDL, VLDL, LDLCALC   Current Medications: Current Outpatient Prescriptions  Medication Sig Dispense Refill  . atorvastatin (LIPITOR) 20 MG tablet   2  . clonazePAM (KLONOPIN) 0.5 MG tablet Take 1 tablet (0.5 mg total) by mouth at bedtime. 90 tablet 0  . diclofenac sodium (VOLTAREN) 1 % GEL Apply 2 g topically 4 (four) times daily. (Patient not taking: Reported on 12/08/2016) 3 Tube 1  . DULoxetine (CYMBALTA) 30 MG capsule Take 1 capsule (30 mg total) by mouth daily. 90 capsule 0  . estrogen-methylTESTOSTERone (ESTRATEST) 1.25-2.5 MG per tablet     . lamoTRIgine (LAMICTAL) 150 MG tablet Take 1 tablet (150 mg total) by mouth daily. 90 tablet 0  .  minocycline (MINOCIN,DYNACIN) 100 MG capsule   3  . triamterene-hydrochlorothiazide (MAXZIDE-25) 37.5-25 MG tablet   1  . venlafaxine XR (EFFEXOR-XR) 150 MG 24 hr capsule Take 2 capsules (300 mg total) by mouth daily with breakfast. 180 capsule 0   No current facility-administered medications for this visit.     Neurologic: Headache: No Seizure: No Paresthesias: No  Musculoskeletal: Strength & Muscle Tone: within normal limits Gait & Station: normal Patient leans: N/A  Psychiatric Specialty Exam: Review of Systems  Respiratory: Positive for cough.     Blood pressure 113/75, pulse 85, height 5\' 6"  (1.676 m), weight 135 lb (61.2 kg), SpO2 97 %.There is no height or weight on file to calculate BMI.  General Appearance: Casual  Eye Contact:  Good  Speech:  Clear and Coherent  Volume:  Normal  Mood:  Anxious  Affect:  Congruent  Thought Process:  Goal Directed  Orientation:  Full (Time, Place, and Person)  Thought Content: Logical and Rumination   Suicidal Thoughts:  No  Homicidal Thoughts:  No  Memory:  Immediate;   Good Recent;   Good Remote;   Good  Judgement:  Good  Insight:  Good  Psychomotor Activity:  Normal  Concentration:  Concentration: Good and Attention Span: Good  Recall:  Good  Fund of Knowledge: Good  Language: Good  Akathisia:  No  Handed:  Right  AIMS (if indicated):  0  Assets:  Communication Skills Desire for Improvement Housing Resilience Social Support  ADL's:  Intact  Cognition: WNL  Sleep:  ok   Assessment: Major depressive disorder, recurrent.  Anxiety disorder NOS.  Panic attacks.  Plan: Reassurance given.  Patient feel current medicine working.  I will continue Lamictal 150 mg daily, Klonopin 0.5 mg at bedtime, Effexor 150 mg 2 tablet daily and Cymbalta 30 mg daily.  Patient does not have any serotonin syndrome.  She does not have any tremors, shakes or any EPS.  Recommended to call us back if she has any question or any concern.   Follow-up in 3 months.  Geremy Rister T., MD 03/10/2017, 1:19 PM

## 2017-06-10 ENCOUNTER — Ambulatory Visit (HOSPITAL_COMMUNITY): Payer: 59 | Admitting: Psychiatry

## 2017-06-10 ENCOUNTER — Encounter (HOSPITAL_COMMUNITY): Payer: Self-pay | Admitting: Psychiatry

## 2017-06-10 DIAGNOSIS — Z810 Family history of intellectual disabilities: Secondary | ICD-10-CM | POA: Diagnosis not present

## 2017-06-10 DIAGNOSIS — F17210 Nicotine dependence, cigarettes, uncomplicated: Secondary | ICD-10-CM

## 2017-06-10 DIAGNOSIS — F331 Major depressive disorder, recurrent, moderate: Secondary | ICD-10-CM

## 2017-06-10 DIAGNOSIS — Z634 Disappearance and death of family member: Secondary | ICD-10-CM

## 2017-06-10 DIAGNOSIS — Z818 Family history of other mental and behavioral disorders: Secondary | ICD-10-CM

## 2017-06-10 DIAGNOSIS — F419 Anxiety disorder, unspecified: Secondary | ICD-10-CM | POA: Diagnosis not present

## 2017-06-10 DIAGNOSIS — Z811 Family history of alcohol abuse and dependence: Secondary | ICD-10-CM

## 2017-06-10 MED ORDER — DULOXETINE HCL 30 MG PO CPEP: 30.0000 mg | ORAL_CAPSULE | Freq: Every day | ORAL | 0 refills | Status: DC

## 2017-06-10 MED ORDER — VENLAFAXINE HCL ER 150 MG PO CP24: 300.0000 mg | ORAL_CAPSULE | Freq: Every day | ORAL | 0 refills | Status: DC

## 2017-06-10 MED ORDER — CLONAZEPAM 0.5 MG PO TABS: 0.5000 mg | ORAL_TABLET | Freq: Every day | ORAL | 0 refills | Status: DC

## 2017-06-10 MED ORDER — LAMOTRIGINE 150 MG PO TABS: 150.0000 mg | ORAL_TABLET | Freq: Every day | ORAL | 0 refills | Status: DC

## 2017-06-10 NOTE — Progress Notes (Signed)
BH MD/PA/NP OP Progress Note  06/10/2017 3:30 PM Kayla [name]  MRN:  [id number]  Chief Complaint: I am sad.  My mother died in 2023-05-05.  HPI: Kayla [name] came for her follow-up appointment.  She is sad because her mother died in 05-05-23.  However she knew that she is declining.  She had a pneumonia in July but never got better.  She died peacefully and she was with her all the time.  She was explained by hospice and she was comfortable with the process.  She is relieved that she is in a peaceful place and is not hurting.  Her husband was very cooperative.  2 weeks after the death of patient's mother her husband's mom got a UTI and she was busy helping her.  Her mother in law is blind in the left out of the town.  Patient realized that she missed her mother a lot but she is feeling better because medicine working.  She denies any irritability, anger, mania, psychosis.  She denies any suicidal thoughts.  Sometimes she has racing thoughts and rumination and she is thinking about her mother but her husband is very supportive.  She is planning to go beach next week with the husband.  Patient denies drinking alcohol or using any illegal substances.  She takes her medication on time.  Her energy level is good.  Visit Diagnosis:    ICD-10-CM   1. Major depressive disorder, recurrent episode, moderate (HCC) F33.1 venlafaxine XR (EFFEXOR-XR) 150 MG 24 hr capsule    lamoTRIgine (LAMICTAL) 150 MG tablet    DULoxetine (CYMBALTA) 30 MG capsule    clonazePAM (KLONOPIN) 0.5 MG tablet    Past Psychiatric History: Reviewed. Patient has one psychiatric hospitalization in 2007 when she was feeling depressed and having panic attack and unable to work. She denies any history of suicidal attempt, mania, psychosis. In the past she had tried Zoloft, Remeron, Wellbutrin, Prozac, Abilify, Xanax and Risperdal.  Past Medical History:  Past Medical History:  Diagnosis Date  . Anxiety   . Depression   . GERD  (gastroesophageal reflux disease)     Past Surgical History:  Procedure Laterality Date  . ABDOMINAL HYSTERECTOMY    . APPENDECTOMY    . BACK SURGERY      Family Psychiatric History: Reviewed.  Family History:  Family History  Problem Relation Age of Onset  . Depression Mother   . Alcohol abuse Father   . Depression Brother   . Dementia Maternal Uncle     Social History:  Social History   Socioeconomic History  . Marital status: Married    Spouse name: Not on file  . Number of children: Not on file  . Years of education: Not on file  . Highest education level: Not on file  Social Needs  . Financial resource strain: Not on file  . Food insecurity - worry: Not on file  . Food insecurity - inability: Not on file  . Transportation needs - medical: Not on file  . Transportation needs - non-medical: Not on file  Occupational History  . Not on file  Tobacco Use  . Smoking status: Current Every Day Smoker    Packs/day: 0.50    Years: 30.00    Pack years: 15.00    Types: Cigarettes  . Smokeless tobacco: Never Used  Substance and Sexual Activity  . Alcohol use: No    Alcohol/week: 0.0 oz  . Drug use: No  . Sexual activity: Yes  Partners: Male    Birth control/protection: Surgical  Other Topics Concern  . Not on file  Social History Narrative  . Not on file    Allergies: No Known Allergies  Metabolic Disorder Labs: No results found for: HGBA1C, MPG No results found for: PROLACTIN No results found for: CHOL, TRIG, HDL, CHOLHDL, VLDL, LDLCALC No results found for: TSH  Therapeutic Level Labs: No results found for: LITHIUM No results found for: VALPROATE No components found for:  CBMZ  Current Medications: Current Outpatient Medications  Medication Sig Dispense Refill  . atorvastatin (LIPITOR) 20 MG tablet   2  . clonazePAM (KLONOPIN) 0.5 MG tablet Take 1 tablet (0.5 mg total) by mouth at bedtime. 90 tablet 0  . diclofenac sodium (VOLTAREN) 1 % GEL Apply  2 g topically 4 (four) times daily. (Patient not taking: Reported on 12/08/2016) 3 Tube 1  . DULoxetine (CYMBALTA) 30 MG capsule Take 1 capsule (30 mg total) by mouth daily. 90 capsule 0  . estrogen-methylTESTOSTERone (ESTRATEST) 1.25-2.5 MG per tablet     . lamoTRIgine (LAMICTAL) 150 MG tablet Take 1 tablet (150 mg total) by mouth daily. 90 tablet 0  . minocycline (MINOCIN,DYNACIN) 100 MG capsule   3  . triamterene-hydrochlorothiazide (MAXZIDE-25) 37.5-25 MG tablet   1  . venlafaxine XR (EFFEXOR-XR) 150 MG 24 hr capsule Take 2 capsules (300 mg total) by mouth daily with breakfast. 180 capsule 0   No current facility-administered medications for this visit.      Musculoskeletal: Strength & Muscle Tone: within normal limits Gait & Station: normal Patient leans: N/A  Psychiatric Specialty Exam: ROS  There were no vitals taken for this visit.There is no height or weight on file to calculate BMI.  General Appearance: Casual  Eye Contact:  Good  Speech:  Clear and Coherent  Volume:  Normal  Mood:  sad  Affect:  Congruent  Thought Process:  Goal Directed  Orientation:  Full (Time, Place, and Person)  Thought Content: Rumination   Suicidal Thoughts:  No  Homicidal Thoughts:  No  Memory:  Immediate;   Good Recent;   Good Remote;   Good  Judgement:  Good  Insight:  Good  Psychomotor Activity:  Normal  Concentration:  Concentration: Good and Attention Span: Good  Recall:  Good  Fund of Knowledge: Good  Language: Good  Akathisia:  No  Handed:  Right  AIMS (if indicated): not done  Assets:  Communication Skills Desire for Improvement Housing Social Support  ADL's:  Intact  Cognition: WNL  Sleep:  Good   Screenings: PHQ2-9     Counselor from 11/26/2015 in [place]  PHQ-2 Total Score  4  PHQ-9 Total Score  14       Assessment and Plan: Major depressive disorder, recurrent.  Anxiety disorder NOS.  Reassurance given.  Recommended  hospice counseling but patient does not feel she needed.  She is doing much better on the medication.  She has no serotonin syndrome including tremors shakes or any EPS.  Continue Lamictal 150 mg daily, Klonopin 0.5 mg at bedtime, Effexor and 50 mg 2 tablets daily and Cymbalta 30 mg daily.  Recommended to call us back if she has any question or any concern.  Follow-up in 3 months.   Deane Wattenbarger T., MD 06/10/2017, 3:30 PM

## 2017-07-10 DIAGNOSIS — Z23 Encounter for immunization: Secondary | ICD-10-CM | POA: Diagnosis not present

## 2017-09-06 ENCOUNTER — Encounter: Payer: Self-pay | Admitting: Internal Medicine

## 2017-09-08 ENCOUNTER — Encounter (HOSPITAL_COMMUNITY): Payer: Self-pay | Admitting: Psychiatry

## 2017-09-08 ENCOUNTER — Ambulatory Visit (HOSPITAL_COMMUNITY): Payer: 59 | Admitting: Psychiatry

## 2017-09-08 DIAGNOSIS — Z818 Family history of other mental and behavioral disorders: Secondary | ICD-10-CM | POA: Diagnosis not present

## 2017-09-08 DIAGNOSIS — F419 Anxiety disorder, unspecified: Secondary | ICD-10-CM

## 2017-09-08 DIAGNOSIS — Z560 Unemployment, unspecified: Secondary | ICD-10-CM

## 2017-09-08 DIAGNOSIS — Z811 Family history of alcohol abuse and dependence: Secondary | ICD-10-CM | POA: Diagnosis not present

## 2017-09-08 DIAGNOSIS — Z0000 Encounter for general adult medical examination without abnormal findings: Secondary | ICD-10-CM | POA: Diagnosis not present

## 2017-09-08 DIAGNOSIS — F17210 Nicotine dependence, cigarettes, uncomplicated: Secondary | ICD-10-CM

## 2017-09-08 DIAGNOSIS — E7800 Pure hypercholesterolemia, unspecified: Secondary | ICD-10-CM | POA: Diagnosis not present

## 2017-09-08 DIAGNOSIS — F331 Major depressive disorder, recurrent, moderate: Secondary | ICD-10-CM

## 2017-09-08 DIAGNOSIS — Z810 Family history of intellectual disabilities: Secondary | ICD-10-CM | POA: Diagnosis not present

## 2017-09-08 DIAGNOSIS — G4700 Insomnia, unspecified: Secondary | ICD-10-CM | POA: Diagnosis not present

## 2017-09-08 DIAGNOSIS — Z8639 Personal history of other endocrine, nutritional and metabolic disease: Secondary | ICD-10-CM | POA: Diagnosis not present

## 2017-09-08 MED ORDER — SUVOREXANT 10 MG PO TABS: 10.0000 mg | ORAL_TABLET | Freq: Every evening | ORAL | 0 refills | Status: DC | PRN

## 2017-09-08 MED ORDER — DULOXETINE HCL 30 MG PO CPEP: 30.0000 mg | ORAL_CAPSULE | Freq: Every day | ORAL | 0 refills | Status: DC

## 2017-09-08 MED ORDER — VENLAFAXINE HCL ER 150 MG PO CP24: 300.0000 mg | ORAL_CAPSULE | Freq: Every day | ORAL | 0 refills | Status: DC

## 2017-09-08 MED ORDER — LAMOTRIGINE 150 MG PO TABS: 150.0000 mg | ORAL_TABLET | Freq: Every day | ORAL | 0 refills | Status: DC

## 2017-09-08 NOTE — Progress Notes (Signed)
BH MD/PA/NP OP Progress Note  09/08/2017 1:28 PM Kayla [name]  MRN:  [id number]  Chief Complaint: I am doing fine but Klonopin is not helping my sleep.  I am only getting 4 hours.  HPI: Kayla [name] came for her follow-up appointment.  She is taking medication as prescribed.  She had a quite Christmas because she lost her mother in September.  Now she is cleaning her mother's house and after that she is going to sell it.  She told there is a lot of emotions and memories in the house.  She admitted medicines working and she does not want to change but she is concerned her insomnia.  She is only sleeping few hours.  When she is up she is wide awake and cannot go back to sleep.  Patient told then she realized started thinking too much.  She is a retired woman and she does not have a lot of work to do.  She has 3 back surgeries and she cannot do a lot of physical work.  She lives with her husband who is very supportive.  Patient denies any mania, psychosis, hallucination.  After the Christmas she went to [facility] and she had a good time.  She has a house at [employer].  Patient has no tremors, shakes or any EPS.  She has no rash or itching.  She like the current medication but wondering if she can try something else for sleep.  Patient denies any suicidal thoughts or homicidal thought.  She recently seen her primary care physician at [facility] but there were no changes in the medication.  Patient denies drinking alcohol or using any illegal substances.  Energy level is good.  Her appetite is okay.  Her vital signs are stable.  Visit Diagnosis:    ICD-10-CM   1. Major depressive disorder, recurrent episode, moderate (HCC) F33.1 Suvorexant (BELSOMRA) 10 MG TABS    lamoTRIgine (LAMICTAL) 150 MG tablet    DULoxetine (CYMBALTA) 30 MG capsule    venlafaxine XR (EFFEXOR-XR) 150 MG 24 hr capsule    Past Psychiatric History: Viewed Patient has one psychiatric hospitalization in 2007 when she was  feeling depressed and having panic attack and unable to work. She denies any history of suicidal attempt, mania, psychosis. In the past she had tried Zoloft, Remeron, Wellbutrin, Prozac, Ambien CR, Abilify, Xanax and Risperdal.  Past Medical History:  Past Medical History:  Diagnosis Date  . Anxiety   . Depression   . GERD (gastroesophageal reflux disease)     Past Surgical History:  Procedure Laterality Date  . ABDOMINAL HYSTERECTOMY    . APPENDECTOMY    . BACK SURGERY      Family Psychiatric History: Reviewed.  Family History:  Family History  Problem Relation Age of Onset  . Depression Mother   . Alcohol abuse Father   . Depression Brother   . Dementia Maternal Uncle     Social History:  Social History   Socioeconomic History  . Marital status: Married    Spouse name: None  . Number of children: None  . Years of education: None  . Highest education level: None  Social Needs  . Financial resource strain: None  . Food insecurity - worry: None  . Food insecurity - inability: None  . Transportation needs - medical: None  . Transportation needs - non-medical: None  Occupational History  . None  Tobacco Use  . Smoking status: Current Every Day Smoker  Packs/day: 0.50    Years: 30.00    Pack years: 15.00    Types: Cigarettes  . Smokeless tobacco: Never Used  Substance and Sexual Activity  . Alcohol use: No    Alcohol/week: 0.0 oz  . Drug use: No  . Sexual activity: Not Currently    Partners: Male    Birth control/protection: Surgical  Other Topics Concern  . None  Social History Narrative  . None    Allergies: No Known Allergies  Metabolic Disorder Labs: No results found for: HGBA1C, MPG No results found for: PROLACTIN No results found for: CHOL, TRIG, HDL, CHOLHDL, VLDL, LDLCALC No results found for: TSH  Therapeutic Level Labs: No results found for: LITHIUM No results found for: VALPROATE No components found for:  CBMZ  Current  Medications: Current Outpatient Medications  Medication Sig Dispense Refill  . atorvastatin (LIPITOR) 20 MG tablet   2  . clonazePAM (KLONOPIN) 0.5 MG tablet Take 1 tablet (0.5 mg total) at bedtime by mouth. 90 tablet 0  . diclofenac sodium (VOLTAREN) 1 % GEL Apply 2 g topically 4 (four) times daily. 3 Tube 1  . DULoxetine (CYMBALTA) 30 MG capsule Take 1 capsule (30 mg total) daily by mouth. 90 capsule 0  . estrogen-methylTESTOSTERone (ESTRATEST) 1.25-2.5 MG per tablet     . lamoTRIgine (LAMICTAL) 150 MG tablet Take 1 tablet (150 mg total) daily by mouth. 90 tablet 0  . minocycline (MINOCIN,DYNACIN) 100 MG capsule   3  . triamterene-hydrochlorothiazide (MAXZIDE-25) 37.5-25 MG tablet   1  . venlafaxine XR (EFFEXOR-XR) 150 MG 24 hr capsule Take 2 capsules (300 mg total) daily with breakfast by mouth. 180 capsule 0   No current facility-administered medications for this visit.      Musculoskeletal: Strength & Muscle Tone: within normal limits Gait & Station: normal Patient leans: N/A  Psychiatric Specialty Exam: ROS  Blood pressure 120/64, pulse 93, height 5\' 6"  (1.676 m), weight 136 lb (61.7 kg).Body mass index is 21.95 kg/m.  General Appearance: Casual  Eye Contact:  Good  Speech:  Clear and Coherent  Volume:  Normal  Mood:  Anxious  Affect:  Appropriate  Thought Process:  Goal Directed  Orientation:  Full (Time, Place, and Person)  Thought Content: Logical   Suicidal Thoughts:  No  Homicidal Thoughts:  No  Memory:  Immediate;   Good Recent;   Good Remote;   Good  Judgement:  Good  Insight:  Good  Psychomotor Activity:  Normal  Concentration:  Concentration: Good and Attention Span: Good  Recall:  Good  Fund of Knowledge: Good  Language: Good  Akathisia:  No  Handed:  Right  AIMS (if indicated): not done  Assets:  Communication Skills Desire for Improvement Housing Physical Health Resilience Social Support  ADL's:  Intact  Cognition: WNL  Sleep:  Fair    Screenings: PHQ2-9     Counselor from 11/26/2015 in [place]  PHQ-2 Total Score  4  PHQ-9 Total Score  14       Assessment and Plan: Major depressive disorder, recurrent.  Anxiety disorder NOS.  Patient is not interested in counseling.  She feel her current medicine is working but like to try something different other than Klonopin.  I will discontinue Klonopin we will start Belsomra 10 mg.  I have provided 1 week samples.  If patient like the medication she will call us to get refills.  I will continue Lamictal 150 mg daily, Effexor 150 mg 2  tablet daily and Cymbalta 30 mg daily.  Patient has no tremors, rash, itching or any shakes.  She has no symptoms of serotonin syndrome.  Discussed medication side effect especially hypnotic can cause postural hypotension, sedation and risk of fall.  Recommended to call us back if he has any question or any concern.  Follow-up in 3 months.   Kathlee Nations, MD 09/08/2017, 1:28 PM

## 2017-09-10 ENCOUNTER — Telehealth (HOSPITAL_COMMUNITY): Payer: Self-pay

## 2017-09-10 DIAGNOSIS — F419 Anxiety disorder, unspecified: Secondary | ICD-10-CM

## 2017-09-10 MED ORDER — CLONAZEPAM 0.5 MG PO TABS: 0.5000 mg | ORAL_TABLET | Freq: Every day | ORAL | 2 refills | Status: DC

## 2017-09-10 NOTE — Telephone Encounter (Signed)
Patient reports she had too many side affects with the [place] - heart racing, bad dreams, and headaches. She would like you to please send the Klonopin to [facility]. Thank you

## 2017-09-10 NOTE — Addendum Note (Signed)
Addended by: Berniece Andreas T on: 09/10/2017 10:04 AM   Modules accepted: Orders

## 2017-09-10 NOTE — Telephone Encounter (Signed)
Please call patient to discontinue Belsomra.  I called Klonopin 0.5 mg at bedtime [facility] are stable with 2 additional refills.

## 2017-09-10 NOTE — Telephone Encounter (Signed)
I called patient and let her know that she could pick up prescription of Klonopin and to stop the Belsomra

## 2017-09-12 ENCOUNTER — Other Ambulatory Visit: Payer: Self-pay | Admitting: Internal Medicine

## 2017-09-12 DIAGNOSIS — Z720 Tobacco use: Secondary | ICD-10-CM

## 2017-10-15 ENCOUNTER — Ambulatory Visit
Admission: RE | Admit: 2017-10-15 | Discharge: 2017-10-15 | Disposition: A | Discharge status: Home / Self Care | Payer: 59 | Source: Ambulatory Visit | Attending: Internal Medicine | Admitting: Internal Medicine

## 2017-10-15 DIAGNOSIS — Z720 Tobacco use: Secondary | ICD-10-CM

## 2017-10-15 DIAGNOSIS — Z87891 Personal history of nicotine dependence: Secondary | ICD-10-CM | POA: Diagnosis not present

## 2017-10-20 ENCOUNTER — Encounter: Payer: Self-pay | Admitting: *Deleted

## 2017-11-04 ENCOUNTER — Other Ambulatory Visit: Payer: Self-pay

## 2017-11-04 ENCOUNTER — Ambulatory Visit (AMBULATORY_SURGERY_CENTER): Payer: Self-pay | Admitting: *Deleted

## 2017-11-04 VITALS — Ht 66.0 in | Wt 135.0 lb

## 2017-11-04 DIAGNOSIS — Z86010 Personal history of colonic polyps: Secondary | ICD-10-CM

## 2017-11-04 MED ORDER — NA SULFATE-K SULFATE-MG SULF 17.5-3.13-1.6 GM/177ML PO SOLN: 1.0000 [IU] | Freq: Once | ORAL | 0 refills | Status: AC

## 2017-11-04 NOTE — Addendum Note (Signed)
Addended by: Ronelle Nigh on: 11/04/2017 02:17 PM   Modules accepted: Orders

## 2017-11-04 NOTE — Progress Notes (Signed)
No egg or soy allergy known to patient  No issues with past sedation with any surgeries  or procedures, no intubation problems  No diet pills per patient No home 02 use per patient  No blood thinners per patient  Pt denies issues with constipation Herbal Tea  With Synna No A fib or A flutter EMMI video sent to pt's e mail Pt. declined

## 2017-11-05 ENCOUNTER — Encounter: Payer: Self-pay | Admitting: Internal Medicine

## 2017-11-08 DIAGNOSIS — M67432 Ganglion, left wrist: Secondary | ICD-10-CM | POA: Diagnosis not present

## 2017-11-12 DIAGNOSIS — J209 Acute bronchitis, unspecified: Secondary | ICD-10-CM | POA: Diagnosis not present

## 2017-11-12 DIAGNOSIS — J9801 Acute bronchospasm: Secondary | ICD-10-CM | POA: Diagnosis not present

## 2017-11-18 ENCOUNTER — Encounter: Payer: Self-pay | Admitting: Internal Medicine

## 2017-12-07 ENCOUNTER — Ambulatory Visit (HOSPITAL_COMMUNITY): Payer: Self-pay | Admitting: Psychiatry

## 2017-12-07 DIAGNOSIS — L818 Other specified disorders of pigmentation: Secondary | ICD-10-CM | POA: Diagnosis not present

## 2017-12-07 DIAGNOSIS — M6740 Ganglion, unspecified site: Secondary | ICD-10-CM | POA: Diagnosis not present

## 2017-12-07 DIAGNOSIS — L700 Acne vulgaris: Secondary | ICD-10-CM | POA: Diagnosis not present

## 2017-12-08 ENCOUNTER — Ambulatory Visit (INDEPENDENT_AMBULATORY_CARE_PROVIDER_SITE_OTHER): Payer: 59 | Admitting: Psychiatry

## 2017-12-08 ENCOUNTER — Encounter (HOSPITAL_COMMUNITY): Payer: Self-pay | Admitting: Psychiatry

## 2017-12-08 VITALS — BP 110/66 | HR 86 | Ht 66.0 in | Wt 127.0 lb

## 2017-12-08 DIAGNOSIS — F17210 Nicotine dependence, cigarettes, uncomplicated: Secondary | ICD-10-CM | POA: Diagnosis not present

## 2017-12-08 DIAGNOSIS — F419 Anxiety disorder, unspecified: Secondary | ICD-10-CM

## 2017-12-08 DIAGNOSIS — Z818 Family history of other mental and behavioral disorders: Secondary | ICD-10-CM

## 2017-12-08 DIAGNOSIS — F331 Major depressive disorder, recurrent, moderate: Secondary | ICD-10-CM | POA: Diagnosis not present

## 2017-12-08 DIAGNOSIS — Z811 Family history of alcohol abuse and dependence: Secondary | ICD-10-CM

## 2017-12-08 DIAGNOSIS — F410 Panic disorder [episodic paroxysmal anxiety] without agoraphobia: Secondary | ICD-10-CM | POA: Diagnosis not present

## 2017-12-08 DIAGNOSIS — Z810 Family history of intellectual disabilities: Secondary | ICD-10-CM

## 2017-12-08 MED ORDER — LAMOTRIGINE 150 MG PO TABS: 150.0000 mg | ORAL_TABLET | Freq: Every day | ORAL | 0 refills | Status: DC

## 2017-12-08 MED ORDER — CLONAZEPAM 0.5 MG PO TABS: 0.5000 mg | ORAL_TABLET | Freq: Every day | ORAL | 2 refills | Status: DC

## 2017-12-08 MED ORDER — DULOXETINE HCL 30 MG PO CPEP: 30.0000 mg | ORAL_CAPSULE | Freq: Every day | ORAL | 0 refills | Status: DC

## 2017-12-08 MED ORDER — VENLAFAXINE HCL ER 150 MG PO CP24: 300.0000 mg | ORAL_CAPSULE | Freq: Every day | ORAL | 0 refills | Status: DC

## 2017-12-08 NOTE — Progress Notes (Signed)
[place] MD/PA/NP OP Progress Note  12/08/2017 2:40 PM Kayla [name]  MRN:  [id number]  Chief Complaint: I am doing better.  I am sleeping 6 hours.  HPI: Kayla [name] came for her follow-up appointment.  On her last visit we try Belsomra but she did not tolerate the medication.  She was having headaches, feeling weird and she calls back to restart Klonopin.  She is feeling Klonopin at least working for 6 hours of sleep.  She is happy recently she spent 2 weeks at [facility] and she is going again Monday to spend another week.  Patient is still gets emotional when she talked about her disease mother.  Patient denies any major panic attack.  She denies any irritability, anger, mania, psychosis or any crying spells.  She lives with her husband who is very supportive.  Patient is scheduled to have colonoscopy but recently she had bronchitis and her colonoscopy was rescheduled.  Patient is taking Lamictal, Cymbalta, Klonopin, Effexor.  She has no rash, itching, tremors or shakes.  Recently her nephew diagnosed with brain tumor and she is hoping to have him speedy recovery.  Her energy level is good.  Patient denies drinking alcohol or using any illegal substances.  Her appetite is okay.  Visit Diagnosis:    ICD-10-CM   1. Anxiety F41.9 clonazePAM (KLONOPIN) 0.5 MG tablet    DULoxetine (CYMBALTA) 30 MG capsule  2. Major depressive disorder, recurrent episode, moderate (HCC) F33.1 venlafaxine XR (EFFEXOR-XR) 150 MG 24 hr capsule    lamoTRIgine (LAMICTAL) 150 MG tablet    Past Psychiatric History: Reviewed. Patient has one psychiatric hospitalization in 2007 when she was feeling depressed and having panic attack and unable to work. She denies any history of suicidal attempt, mania, psychosis. In the past she had tried Zoloft, Remeron, Wellbutrin, Prozac, Ambien CR, Abilify, Xanax, belsomra and Risperdal.  Past Medical History:  Past Medical History:  Diagnosis Date  . Anxiety   . Cancer ([place])    cervical 1988  .  Depression   . GERD (gastroesophageal reflux disease)   . Hyperlipidemia     Past Surgical History:  Procedure Laterality Date  . ABDOMINAL HYSTERECTOMY    . APPENDECTOMY    . BACK SURGERY     x 3  . COLONOSCOPY    . POLYPECTOMY      Family Psychiatric History: Reviewed.  Family History:  Family History  Problem Relation Age of Onset  . Depression Mother   . Alcohol abuse Father   . Depression Brother   . Dementia Maternal Uncle   . Colon cancer Maternal Aunt   . Colon polyps Neg Hx   . Esophageal cancer Neg Hx   . Stomach cancer Neg Hx   . Rectal cancer Neg Hx     Social History:  Social History   Socioeconomic History  . Marital status: Married    Spouse name: Not on file  . Number of children: Not on file  . Years of education: Not on file  . Highest education level: Not on file  Occupational History  . Not on file  Social Needs  . Financial resource strain: Not on file  . Food insecurity:    Worry: Not on file    Inability: Not on file  . Transportation needs:    Medical: Not on file    Non-medical: Not on file  Tobacco Use  . Smoking status: Current Every Day Smoker    Packs/day: 0.50    Years: 30.00  Pack years: 15.00    Types: Cigarettes  . Smokeless tobacco: Never Used  Substance and Sexual Activity  . Alcohol use: No    Alcohol/week: 0.0 oz  . Drug use: No  . Sexual activity: Not Currently    Partners: Male    Birth control/protection: Surgical  Lifestyle  . Physical activity:    Days per week: Not on file    Minutes per session: Not on file  . Stress: Not on file  Relationships  . Social connections:    Talks on phone: Not on file    Gets together: Not on file    Attends religious service: Not on file    Active member of club or organization: Not on file    Attends meetings of clubs or organizations: Not on file    Relationship status: Not on file  Other Topics Concern  . Not on file  Social History Narrative  . Not on file     Allergies: No Known Allergies  Metabolic Disorder Labs: No results found for: HGBA1C, MPG No results found for: PROLACTIN No results found for: CHOL, TRIG, HDL, CHOLHDL, VLDL, LDLCALC No results found for: TSH  Therapeutic Level Labs: No results found for: LITHIUM No results found for: VALPROATE No components found for:  CBMZ  Current Medications: Current Outpatient Medications  Medication Sig Dispense Refill  . atorvastatin (LIPITOR) 20 MG tablet   2  . clonazePAM (KLONOPIN) 0.5 MG tablet Take 1 tablet (0.5 mg total) by mouth at bedtime. 30 tablet 2  . estradiol (ESTRACE) 1 MG tablet Take 1 mg by mouth daily.    . minocycline (MINOCIN,DYNACIN) 100 MG capsule   3  . Multiple Vitamin (MULTIVITAMIN) tablet Take 1 tablet by mouth daily.    Marland Kitchen triamterene-hydrochlorothiazide (MAXZIDE-25) 37.5-25 MG tablet   1  . venlafaxine XR (EFFEXOR-XR) 150 MG 24 hr capsule Take 2 capsules (300 mg total) by mouth daily with breakfast. 180 capsule 0   No current facility-administered medications for this visit.      Musculoskeletal: Strength & Muscle Tone: within normal limits Gait & Station: normal Patient leans: N/A  Psychiatric Specialty Exam: ROS  Blood pressure 110/66, pulse 86, height 5\' 6"  (1.676 m), weight 127 lb (57.6 kg).Body mass index is 20.5 kg/m.  General Appearance: Casual  Eye Contact:  Good  Speech:  Clear and Coherent  Volume:  Normal  Mood:  Euthymic  Affect:  Appropriate  Thought Process:  Goal Directed  Orientation:  Full (Time, Place, and Person)  Thought Content: Logical   Suicidal Thoughts:  No  Homicidal Thoughts:  No  Memory:  Immediate;   Good Recent;   Good Remote;   Good  Judgement:  Good  Insight:  Good  Psychomotor Activity:  Normal  Concentration:  Concentration: Good and Attention Span: Good  Recall:  Good  Fund of Knowledge: Good  Language: Good  Akathisia:  No  Handed:  Right  AIMS (if indicated): not done  Assets:  Communication  Skills Desire for Improvement Housing Resilience Social Support  ADL's:  Intact  Cognition: WNL  Sleep:  Fair   Screenings: PHQ2-9     Counselor from 11/26/2015 in [place]  PHQ-2 Total Score  4  PHQ-9 Total Score  14       Assessment and Plan: Major depressive disorder, recurrent.  Anxiety disorder NOS.  Patient doing better since she back on Klonopin.  Continue Klonopin 0.5 mg at bedtime, discontinue Belsomra.  Continue  Lamictal 150 mg daily, Cymbalta 30 mg daily and Effexor XR 150 mg 2 tablet daily.  Discussed medication side effects and benefits.  Recommended to call us back if she has any question, concern if you feel worsening of the symptoms.  Follow-up in 3 months.  Patient is not interested in counseling.   Kathlee Nations, MD 12/08/2017, 2:40 PM

## 2017-12-10 DIAGNOSIS — H52202 Unspecified astigmatism, left eye: Secondary | ICD-10-CM | POA: Diagnosis not present

## 2017-12-10 DIAGNOSIS — H2513 Age-related nuclear cataract, bilateral: Secondary | ICD-10-CM | POA: Diagnosis not present

## 2017-12-10 DIAGNOSIS — H5203 Hypermetropia, bilateral: Secondary | ICD-10-CM | POA: Diagnosis not present

## 2018-03-07 ENCOUNTER — Other Ambulatory Visit (HOSPITAL_COMMUNITY): Payer: Self-pay

## 2018-03-07 DIAGNOSIS — F331 Major depressive disorder, recurrent, moderate: Secondary | ICD-10-CM

## 2018-03-07 DIAGNOSIS — F419 Anxiety disorder, unspecified: Secondary | ICD-10-CM

## 2018-03-07 MED ORDER — VENLAFAXINE HCL ER 150 MG PO CP24: 300.0000 mg | ORAL_CAPSULE | Freq: Every day | ORAL | 0 refills | Status: DC

## 2018-03-07 MED ORDER — CLONAZEPAM 0.5 MG PO TABS: 0.5000 mg | ORAL_TABLET | Freq: Every day | ORAL | 2 refills | Status: DC

## 2018-03-07 MED ORDER — DULOXETINE HCL 30 MG PO CPEP: 30.0000 mg | ORAL_CAPSULE | Freq: Every day | ORAL | 0 refills | Status: DC

## 2018-03-07 MED ORDER — LAMOTRIGINE 150 MG PO TABS: 150.0000 mg | ORAL_TABLET | Freq: Every day | ORAL | 0 refills | Status: DC

## 2018-03-10 ENCOUNTER — Ambulatory Visit (HOSPITAL_COMMUNITY): Payer: Self-pay | Admitting: Psychiatry

## 2018-04-26 ENCOUNTER — Ambulatory Visit (HOSPITAL_COMMUNITY): Payer: Self-pay | Admitting: Psychiatry

## 2018-05-23 ENCOUNTER — Ambulatory Visit (HOSPITAL_COMMUNITY): Payer: 59 | Admitting: Psychiatry

## 2018-05-23 ENCOUNTER — Encounter (HOSPITAL_COMMUNITY): Payer: Self-pay | Admitting: Psychiatry

## 2018-05-23 DIAGNOSIS — F331 Major depressive disorder, recurrent, moderate: Secondary | ICD-10-CM | POA: Diagnosis not present

## 2018-05-23 DIAGNOSIS — F17210 Nicotine dependence, cigarettes, uncomplicated: Secondary | ICD-10-CM | POA: Diagnosis not present

## 2018-05-23 DIAGNOSIS — F419 Anxiety disorder, unspecified: Secondary | ICD-10-CM

## 2018-05-23 DIAGNOSIS — F411 Generalized anxiety disorder: Secondary | ICD-10-CM | POA: Diagnosis not present

## 2018-05-23 MED ORDER — VENLAFAXINE HCL ER 150 MG PO CP24: 300.0000 mg | ORAL_CAPSULE | Freq: Every day | ORAL | 0 refills | Status: DC

## 2018-05-23 MED ORDER — CLONAZEPAM 0.5 MG PO TABS: 0.5000 mg | ORAL_TABLET | Freq: Every day | ORAL | 2 refills | Status: DC

## 2018-05-23 MED ORDER — LAMOTRIGINE 150 MG PO TABS: 150.0000 mg | ORAL_TABLET | Freq: Every day | ORAL | 0 refills | Status: DC

## 2018-05-23 MED ORDER — DULOXETINE HCL 30 MG PO CPEP: 30.0000 mg | ORAL_CAPSULE | Freq: Every day | ORAL | 0 refills | Status: DC

## 2018-05-23 NOTE — Progress Notes (Signed)
BH MD/PA/NP OP Progress Note  05/23/2018 2:23 PM Kayla [name]  MRN:  [id number]  Chief Complaint: I am not depressed but sometime I get dizziness.  HPI: Kayla [name] came for her follow-up appointment.  She denies any depression or any major anxiety attack but she noticed episodes of dizziness and feel overwhelmed sometimes.  Recently her medicines for acne changed.  She is no longer taking minocycline and her physicians giving her now Aldactone.  Since taking this medication she has noticed dizziness and nervous-like feeling but she does not have any crying spells, anxiety attacks, feeling hopelessness or worthlessness.  She is pleased that her mother's house sold in 1 day.  She has more time to visit beach.  She is trying to lose weight and she has lost more than 15 pounds in past 4 months.  She is more cautious about her weight and health.  She is sleeping good with the medication.  She denies any irritability or any suicidal thoughts.  Her energy level is okay.  Her appetite is okay.  Patient denies drinking or using any illegal substances.  She lives with her husband who is very supportive.  Today her blood pressure is low but she has no chest pain, headaches.  Visit Diagnosis:    ICD-10-CM   1. Major depressive disorder, recurrent episode, moderate (HCC) F33.1 venlafaxine XR (EFFEXOR-XR) 150 MG 24 hr capsule    lamoTRIgine (LAMICTAL) 150 MG tablet  2. Anxiety F41.9 DULoxetine (CYMBALTA) 30 MG capsule    clonazePAM (KLONOPIN) 0.5 MG tablet    Past Psychiatric History: Viewed Patient has one psychiatric hospitalization in 2007 when she was feeling depressed and having panic attack and unable to work. She denies any history of suicidal attempt, mania, psychosis. In the past she had tried Zoloft, Remeron, Wellbutrin, Prozac, Ambien CR, Abilify, Xanax, Belsomra and Risperdal.  Past Medical History:  Past Medical History:  Diagnosis Date  . Anxiety   . Cancer ([place])    cervical 1988  .  Depression   . GERD (gastroesophageal reflux disease)   . Hyperlipidemia     Past Surgical History:  Procedure Laterality Date  . ABDOMINAL HYSTERECTOMY    . APPENDECTOMY    . BACK SURGERY     x 3  . COLONOSCOPY    . POLYPECTOMY      Family Psychiatric History: Reviewed  Family History:  Family History  Problem Relation Age of Onset  . Depression Mother   . Alcohol abuse Father   . Depression Brother   . Dementia Maternal Uncle   . Colon cancer Maternal Aunt   . Colon polyps Neg Hx   . Esophageal cancer Neg Hx   . Stomach cancer Neg Hx   . Rectal cancer Neg Hx     Social History:  Social History   Socioeconomic History  . Marital status: Married    Spouse name: Not on file  . Number of children: Not on file  . Years of education: Not on file  . Highest education level: Not on file  Occupational History  . Not on file  Social Needs  . Financial resource strain: Not on file  . Food insecurity:    Worry: Not on file    Inability: Not on file  . Transportation needs:    Medical: Not on file    Non-medical: Not on file  Tobacco Use  . Smoking status: Current Every Day Smoker    Packs/day: 0.50    Years: 30.00  Pack years: 15.00    Types: Cigarettes  . Smokeless tobacco: Never Used  Substance and Sexual Activity  . Alcohol use: No    Alcohol/week: 0.0 standard drinks  . Drug use: No  . Sexual activity: Not Currently    Partners: Male    Birth control/protection: Surgical  Lifestyle  . Physical activity:    Days per week: Not on file    Minutes per session: Not on file  . Stress: Not on file  Relationships  . Social connections:    Talks on phone: Not on file    Gets together: Not on file    Attends religious service: Not on file    Active member of club or organization: Not on file    Attends meetings of clubs or organizations: Not on file    Relationship status: Not on file  Other Topics Concern  . Not on file  Social History Narrative  .  Not on file    Allergies: No Known Allergies  Metabolic Disorder Labs: No results found for: HGBA1C, MPG No results found for: PROLACTIN No results found for: CHOL, TRIG, HDL, CHOLHDL, VLDL, LDLCALC No results found for: TSH  Therapeutic Level Labs: No results found for: LITHIUM No results found for: VALPROATE No components found for:  CBMZ  Current Medications: Current Outpatient Medications  Medication Sig Dispense Refill  . atorvastatin (LIPITOR) 20 MG tablet   2  . clonazePAM (KLONOPIN) 0.5 MG tablet Take 1 tablet (0.5 mg total) by mouth at bedtime. 30 tablet 2  . DULoxetine (CYMBALTA) 30 MG capsule Take 1 capsule (30 mg total) by mouth daily. 90 capsule 0  . estradiol (ESTRACE) 1 MG tablet Take 1 mg by mouth daily.    Marland Kitchen lamoTRIgine (LAMICTAL) 150 MG tablet Take 1 tablet (150 mg total) by mouth daily. 90 tablet 0  . Multiple Vitamin (MULTIVITAMIN) tablet Take 1 tablet by mouth daily.    Marland Kitchen spironolactone (ALDACTONE) 50 MG tablet Take 50 mg by mouth daily.    Marland Kitchen triamterene-hydrochlorothiazide (MAXZIDE-25) 37.5-25 MG tablet   1  . venlafaxine XR (EFFEXOR-XR) 150 MG 24 hr capsule Take 2 capsules (300 mg total) by mouth daily with breakfast. 180 capsule 0  . minocycline (MINOCIN,DYNACIN) 100 MG capsule   3   No current facility-administered medications for this visit.      Musculoskeletal: Strength & Muscle Tone: within normal limits Gait & Station: normal Patient leans: N/A  Psychiatric Specialty Exam: Review of Systems  Constitutional: Positive for weight loss.  Cardiovascular: Negative for chest pain.  Neurological: Negative for headaches.       Occasional dizziness  Psychiatric/Behavioral: The patient is nervous/anxious.     Blood pressure 108/66, height 5\' 6"  (1.676 m), weight 119 lb (54 kg).Body mass index is 19.21 kg/m.  General Appearance: Well Groomed  Eye Contact:  Good  Speech:  Clear and Coherent  Volume:  Normal  Mood:  Anxious  Affect:  Appropriate   Thought Process:  Goal Directed  Orientation:  Full (Time, Place, and Person)  Thought Content: Logical   Suicidal Thoughts:  No  Homicidal Thoughts:  No  Memory:  Immediate;   Good Recent;   Good Remote;   Good  Judgement:  Good  Insight:  Good  Psychomotor Activity:  Normal  Concentration:  Concentration: Good and Attention Span: Good  Recall:  Good  Fund of Knowledge: Good  Language: Good  Akathisia:  No  Handed:  Right  AIMS (if indicated): not done  Assets:  Communication Skills Desire for Improvement Housing Resilience Social Support  ADL's:  Intact  Cognition: WNL  Sleep:  Good   Screenings: PHQ2-9     Counselor from 11/26/2015 in [place]  PHQ-2 Total Score  4  PHQ-9 Total Score  14       Assessment and Plan: Major depressive disorder, recurrent.  Generalized anxiety disorder.  I reviewed her medication.  I explained that Aldactone is antihypertensive medication which she may be using for acne but also causing low blood pressure and dizziness.  I recommended to talk to her physician about the symptoms.  She also scheduled to have blood work in few weeks to check her creatinine and potassium.  Patient does not want to change her medication.  She feels her current psychiatric medication is working very well for her generalized anxiety and depression.  She has no rash, itching or tremors.  I will continue Lamictal 150 mg daily, Effexor XR 300 mg in the morning, Cymbalta 30 mg daily and Klonopin 0.5 mg at bedtime.  Discussed recent weight loss which she is trying to lose weight.  Patient is not interested in counseling.  I recommended to call us back if she has any question or any concern.  Patient promised that she will contact her primary care physician about low blood pressure.  Follow-up in 3 months.    Kathlee Nations, MD 05/23/2018, 2:23 PM

## 2018-05-24 DIAGNOSIS — Z79899 Other long term (current) drug therapy: Secondary | ICD-10-CM | POA: Diagnosis not present

## 2018-05-24 DIAGNOSIS — Z23 Encounter for immunization: Secondary | ICD-10-CM | POA: Diagnosis not present

## 2018-07-20 DIAGNOSIS — H2513 Age-related nuclear cataract, bilateral: Secondary | ICD-10-CM | POA: Diagnosis not present

## 2018-07-20 DIAGNOSIS — H25013 Cortical age-related cataract, bilateral: Secondary | ICD-10-CM | POA: Diagnosis not present

## 2018-07-22 ENCOUNTER — Other Ambulatory Visit (HOSPITAL_COMMUNITY): Payer: Self-pay | Admitting: Psychiatry

## 2018-07-22 DIAGNOSIS — F419 Anxiety disorder, unspecified: Secondary | ICD-10-CM

## 2018-07-22 DIAGNOSIS — F331 Major depressive disorder, recurrent, moderate: Secondary | ICD-10-CM

## 2018-07-29 DIAGNOSIS — L810 Postinflammatory hyperpigmentation: Secondary | ICD-10-CM | POA: Diagnosis not present

## 2018-07-29 DIAGNOSIS — L570 Actinic keratosis: Secondary | ICD-10-CM | POA: Diagnosis not present

## 2018-07-29 DIAGNOSIS — L700 Acne vulgaris: Secondary | ICD-10-CM | POA: Diagnosis not present

## 2018-07-29 DIAGNOSIS — L300 Nummular dermatitis: Secondary | ICD-10-CM | POA: Diagnosis not present

## 2018-07-29 DIAGNOSIS — D485 Neoplasm of uncertain behavior of skin: Secondary | ICD-10-CM | POA: Diagnosis not present

## 2018-08-17 DIAGNOSIS — H2513 Age-related nuclear cataract, bilateral: Secondary | ICD-10-CM | POA: Diagnosis not present

## 2018-08-17 DIAGNOSIS — H25013 Cortical age-related cataract, bilateral: Secondary | ICD-10-CM | POA: Diagnosis not present

## 2018-08-23 ENCOUNTER — Encounter (HOSPITAL_COMMUNITY): Payer: Self-pay | Admitting: Psychiatry

## 2018-08-23 ENCOUNTER — Ambulatory Visit (HOSPITAL_COMMUNITY): Payer: 59 | Admitting: Psychiatry

## 2018-08-23 DIAGNOSIS — F419 Anxiety disorder, unspecified: Secondary | ICD-10-CM

## 2018-08-23 DIAGNOSIS — F331 Major depressive disorder, recurrent, moderate: Secondary | ICD-10-CM

## 2018-08-23 MED ORDER — LAMOTRIGINE 150 MG PO TABS: 150.0000 mg | ORAL_TABLET | Freq: Every day | ORAL | 0 refills | Status: DC

## 2018-08-23 MED ORDER — VENLAFAXINE HCL ER 150 MG PO CP24: 300.0000 mg | ORAL_CAPSULE | Freq: Every day | ORAL | 0 refills | Status: DC

## 2018-08-23 MED ORDER — CLONAZEPAM 0.5 MG PO TABS: 0.5000 mg | ORAL_TABLET | Freq: Every day | ORAL | 2 refills | Status: DC

## 2018-08-23 MED ORDER — DULOXETINE HCL 30 MG PO CPEP: 30.0000 mg | ORAL_CAPSULE | Freq: Every day | ORAL | 0 refills | Status: DC

## 2018-08-23 NOTE — Progress Notes (Signed)
BH MD/PA/NP OP Progress Note  08/23/2018 2:24 PM Kayla [name]  MRN:  [id number]  Chief Complaint: My father-in-law died and now his sister is in the hospital.  HPI: Clancy came for her follow-up appointment.  She is sad because her father-in-law died in 2023/08/17 and few weeks ago her father-in-law sister had brain injury when she fell in [place].  Patient went to [place] with her husband and she is still in the hospital.  She feels very nervous about her health.  She had a very good relationship with her.  Patient feels the medicine working otherwise she will be in a anxiety basket.  She is sleeping okay.  She denies any irritability, anger, mania or any psychosis.  Her Christmas was very quiet.  She is taking Aldactone and now she denies any dizziness.  Her blood pressure is a stable.  Patient denies any paranoia, anger, hallucination or any suicidal thoughts.  She like to continue her medication.  She has no rash, itching, tremors or shakes.  Her energy level is good.  Visit Diagnosis:    ICD-10-CM   1. Major depressive disorder, recurrent episode, moderate (HCC) F33.1 venlafaxine XR (EFFEXOR-XR) 150 MG 24 hr capsule    lamoTRIgine (LAMICTAL) 150 MG tablet  2. Anxiety F41.9 DULoxetine (CYMBALTA) 30 MG capsule    clonazePAM (KLONOPIN) 0.5 MG tablet    Past Psychiatric History: Reviewed. H/O psychiatric hospitalization in 2007 because of depression and panic attack.  No history of suicidal attempt, mania, psychosis. In the past tried Zoloft, Remeron, Wellbutrin, Prozac, Ambien CR,Abilify, Xanax, Belsomra and Risperdal.  Past Medical History:  Past Medical History:  Diagnosis Date  . Anxiety   . Cancer ([place])    cervical 1988  . Depression   . GERD (gastroesophageal reflux disease)   . Hyperlipidemia     Past Surgical History:  Procedure Laterality Date  . ABDOMINAL HYSTERECTOMY    . APPENDECTOMY    . BACK SURGERY     x 3  . COLONOSCOPY    . POLYPECTOMY      Family Psychiatric  History: Reviewed.  Family History:  Family History  Problem Relation Age of Onset  . Depression Mother   . Alcohol abuse Father   . Depression Brother   . Dementia Maternal Uncle   . Colon cancer Maternal Aunt   . Colon polyps Neg Hx   . Esophageal cancer Neg Hx   . Stomach cancer Neg Hx   . Rectal cancer Neg Hx     Social History:  Social History   Socioeconomic History  . Marital status: Married    Spouse name: Not on file  . Number of children: Not on file  . Years of education: Not on file  . Highest education level: Not on file  Occupational History  . Not on file  Social Needs  . Financial resource strain: Not on file  . Food insecurity:    Worry: Not on file    Inability: Not on file  . Transportation needs:    Medical: Not on file    Non-medical: Not on file  Tobacco Use  . Smoking status: Current Every Day Smoker    Packs/day: 0.25    Years: 30.00    Pack years: 7.50    Types: Cigarettes  . Smokeless tobacco: Never Used  Substance and Sexual Activity  . Alcohol use: No    Alcohol/week: 0.0 standard drinks  . Drug use: No  . Sexual activity: Not Currently  Partners: Male    Birth control/protection: Surgical  Lifestyle  . Physical activity:    Days per week: Not on file    Minutes per session: Not on file  . Stress: Not on file  Relationships  . Social connections:    Talks on phone: Not on file    Gets together: Not on file    Attends religious service: Not on file    Active member of club or organization: Not on file    Attends meetings of clubs or organizations: Not on file    Relationship status: Not on file  Other Topics Concern  . Not on file  Social History Narrative  . Not on file    Allergies: No Known Allergies  Metabolic Disorder Labs: No results found for: HGBA1C, MPG No results found for: PROLACTIN No results found for: CHOL, TRIG, HDL, CHOLHDL, VLDL, LDLCALC No results found for: TSH  Therapeutic Level Labs: No  results found for: LITHIUM No results found for: VALPROATE No components found for:  CBMZ  Current Medications: Current Outpatient Medications  Medication Sig Dispense Refill  . atorvastatin (LIPITOR) 20 MG tablet   2  . clonazePAM (KLONOPIN) 0.5 MG tablet Take 1 tablet (0.5 mg total) by mouth at bedtime. 30 tablet 2  . DULoxetine (CYMBALTA) 30 MG capsule Take 1 capsule (30 mg total) by mouth daily. 90 capsule 0  . estradiol (ESTRACE) 1 MG tablet Take 1 mg by mouth daily.    Marland Kitchen lamoTRIgine (LAMICTAL) 150 MG tablet Take 1 tablet (150 mg total) by mouth daily. 90 tablet 0  . Multiple Vitamin (MULTIVITAMIN) tablet Take 1 tablet by mouth daily.    Marland Kitchen spironolactone (ALDACTONE) 50 MG tablet Take 50 mg by mouth daily.    Marland Kitchen triamterene-hydrochlorothiazide (MAXZIDE-25) 37.5-25 MG tablet   1  . venlafaxine XR (EFFEXOR-XR) 150 MG 24 hr capsule Take 2 capsules (300 mg total) by mouth daily with breakfast. 180 capsule 0   No current facility-administered medications for this visit.      Musculoskeletal: Strength & Muscle Tone: within normal limits Gait & Station: normal Patient leans: N/A  Psychiatric Specialty Exam: ROS  Blood pressure 118/68, height 5\' 6"  (1.676 m), weight 124 lb (56.2 kg).Body mass index is 20.01 kg/m.  General Appearance: Well Groomed  Eye Contact:  Good  Speech:  Clear and Coherent  Volume:  Normal  Mood:  Anxious  Affect:  Congruent  Thought Process:  Goal Directed  Orientation:  Full (Time, Place, and Person)  Thought Content: Rumination   Suicidal Thoughts:  No  Homicidal Thoughts:  No  Memory:  Immediate;   Good Recent;   Good Remote;   Good  Judgement:  Good  Insight:  Good  Psychomotor Activity:  Normal  Concentration:  Concentration: Good and Attention Span: Good  Recall:  Good  Fund of Knowledge: Good  Language: Good  Akathisia:  No  Handed:  Right  AIMS (if indicated): not done  Assets:  Communication Skills Desire for  Improvement Housing Resilience  ADL's:  Intact  Cognition: WNL  Sleep:  Good   Screenings: PHQ2-9     Counselor from 11/26/2015 in [place]  PHQ-2 Total Score  4  PHQ-9 Total Score  14       Assessment and Plan: Depressive disorder, recurrent.  Anxiety.  Patient is a stable on her current medication despite recent family member health issues.  She does not feel she need therapy.  She like to  continue her current medication.  She has no rash, itching or tremors.  I will continue Effexor XR 150 mg 2 capsules daily in the morning, Lamictal 150 mg daily, Cymbalta 30 mg daily and Klonopin 0.5 mg at bedtime.  Recommended to call us back if she has any question or any concern.  Follow-up in 3 months.   Kathlee Nations, MD 08/23/2018, 2:24 PM

## 2018-08-24 DIAGNOSIS — H25011 Cortical age-related cataract, right eye: Secondary | ICD-10-CM | POA: Diagnosis not present

## 2018-08-24 DIAGNOSIS — H25012 Cortical age-related cataract, left eye: Secondary | ICD-10-CM | POA: Diagnosis not present

## 2018-08-24 DIAGNOSIS — H2511 Age-related nuclear cataract, right eye: Secondary | ICD-10-CM | POA: Diagnosis not present

## 2018-08-24 DIAGNOSIS — H2512 Age-related nuclear cataract, left eye: Secondary | ICD-10-CM | POA: Diagnosis not present

## 2018-08-31 DIAGNOSIS — H2511 Age-related nuclear cataract, right eye: Secondary | ICD-10-CM | POA: Diagnosis not present

## 2018-08-31 DIAGNOSIS — H25011 Cortical age-related cataract, right eye: Secondary | ICD-10-CM | POA: Diagnosis not present

## 2018-09-12 DIAGNOSIS — E7800 Pure hypercholesterolemia, unspecified: Secondary | ICD-10-CM | POA: Diagnosis not present

## 2018-09-12 DIAGNOSIS — Z0000 Encounter for general adult medical examination without abnormal findings: Secondary | ICD-10-CM | POA: Diagnosis not present

## 2018-11-10 ENCOUNTER — Other Ambulatory Visit (HOSPITAL_COMMUNITY): Payer: Self-pay | Admitting: Psychiatry

## 2018-11-10 DIAGNOSIS — F331 Major depressive disorder, recurrent, moderate: Secondary | ICD-10-CM

## 2018-11-10 DIAGNOSIS — F419 Anxiety disorder, unspecified: Secondary | ICD-10-CM

## 2018-11-11 ENCOUNTER — Other Ambulatory Visit (HOSPITAL_COMMUNITY): Payer: Self-pay | Admitting: Psychiatry

## 2018-11-11 DIAGNOSIS — F331 Major depressive disorder, recurrent, moderate: Secondary | ICD-10-CM

## 2018-11-22 ENCOUNTER — Encounter (HOSPITAL_COMMUNITY): Payer: Self-pay | Admitting: Psychiatry

## 2018-11-22 ENCOUNTER — Other Ambulatory Visit: Payer: Self-pay

## 2018-11-22 ENCOUNTER — Ambulatory Visit (INDEPENDENT_AMBULATORY_CARE_PROVIDER_SITE_OTHER): Payer: 59 | Admitting: Psychiatry

## 2018-11-22 DIAGNOSIS — F331 Major depressive disorder, recurrent, moderate: Secondary | ICD-10-CM

## 2018-11-22 DIAGNOSIS — F419 Anxiety disorder, unspecified: Secondary | ICD-10-CM | POA: Diagnosis not present

## 2018-11-22 MED ORDER — TRAZODONE HCL 50 MG PO TABS: ORAL_TABLET | ORAL | 1 refills | Status: DC

## 2018-11-22 MED ORDER — VENLAFAXINE HCL ER 150 MG PO CP24: 300.0000 mg | ORAL_CAPSULE | Freq: Every day | ORAL | 0 refills | Status: DC

## 2018-11-22 MED ORDER — LAMOTRIGINE 150 MG PO TABS: 150.0000 mg | ORAL_TABLET | Freq: Every day | ORAL | 0 refills | Status: DC

## 2018-11-22 MED ORDER — DULOXETINE HCL 30 MG PO CPEP: 30.0000 mg | ORAL_CAPSULE | Freq: Every day | ORAL | 0 refills | Status: DC

## 2018-11-22 NOTE — Progress Notes (Signed)
Virtual Visit via Telephone Note  I connected with Kayla [name] on 11/22/18 at  2:20 PM EDT by telephone and verified that I am speaking with the correct person using two identifiers.   I discussed the limitations, risks, security and privacy concerns of performing an evaluation and management service by telephone and the availability of in person appointments. I also discussed with the patient that there may be a patient responsible charge related to this service. The patient expressed understanding and agreed to proceed.   History of Present Illness: Patient was evaluated through phone session.  She is doing okay except not able to sleep well.  She had tried multiple medication in the past and now she is taking Klonopin but some nights she does not sleep more than 2 3 hours.  She is open to try a different medication.  Overall she describes her mood is a stable other than she is very bored home due to pandemic coronavirus.  She like to go to [place] but due to current situation does not want to leave her husband.  She has some anxiety but it is stable.  She denies any irritability, anger, mood swing.  She denies any crying spells or any feeling of hopelessness.  She is taking Effexor, Cymbalta and Lamictal.  She denies any tremors, rash or any itching.  She admitted her weight is stable.  Past Psychiatric History: Reviewed. H/O psychiatric hospitalization in 2007 because of depression and panic attack.  No history of suicidal attempt, mania, psychosis. In the past tried Zoloft, Remeron, Wellbutrin, Prozac, Ambien CR,Abilify, Xanax, Belsomraand Risperdal.    Observations/Objective: Mental status examination done on the phone.  Her speech is clear, coherent with normal tone and volume.  She denies any auditory or visual hallucination.  She denies any active or passive suicidal thoughts or any homicidal thought.  Her attention and concentration is fair.  She is alert and oriented x3.  She has no  delusions, paranoia or any grandiosity.  Her cognition is intact.  Her fund of knowledge is adequate.  She reported no tremors, rash, itching or shakes.  Her insight judgment is okay.  Assessment and Plan: Major depressive disorder, recurrent.  Anxiety.  Discussed current situation related to pandemic coronavirus.  She is hoping to go to [facility] when things get a little bit better.  I recommend to try trazodone which she has never tried before.  Discontinue Klonopin and try trazodone 50 mg 1 to 2 tablet as needed for insomnia.  Continue Effexor 300 mg in the morning, Lamictal 150 mg daily and Cymbalta 30 mg daily.  Discussed medication side effect specially multiple anti-depressant can cause serotonin syndrome.  Patient does not feel she need to see a therapist.  Recommended to call us back if she has any question or any concern.  Follow-up in 3 months.  Recommend to call us back if trazodone working so we can call [facility]-day supply [facility].    Follow Up Instructions:    I discussed the assessment and treatment plan with the patient. The patient was provided an opportunity to ask questions and all were answered. The patient agreed with the plan and demonstrated an understanding of the instructions.   The patient was advised to call back or seek an in-person evaluation if the symptoms worsen or if the condition fails to improve as anticipated.  I provided 20 minutes of non-face-to-face time during this encounter.   Kathlee Nations, MD

## 2019-01-02 ENCOUNTER — Telehealth (HOSPITAL_COMMUNITY): Payer: Self-pay

## 2019-01-02 DIAGNOSIS — F419 Anxiety disorder, unspecified: Secondary | ICD-10-CM

## 2019-01-02 MED ORDER — CLONAZEPAM 0.5 MG PO TABS: 0.5000 mg | ORAL_TABLET | Freq: Every day | ORAL | 1 refills | Status: DC

## 2019-01-02 NOTE — Telephone Encounter (Signed)
She should discontinue trazodone and resume clonazepam 0.5 mg at bedtime.  We will call the prescription with 1 more additional refill.

## 2019-01-02 NOTE — Telephone Encounter (Signed)
Patient called and stated that she cannot tolerate the side affects of her Trazodone 50mg  which include not sleeping.  She asked if she could go back on Clonazepam 0.5mg  with enough to get her to her next scheduled appointment on 02/23/19? She stated she sleeps better on clonzepam. She is using the [facility] in [place] on [address] for this. Please review and advise. Thank you.

## 2019-01-05 NOTE — Telephone Encounter (Signed)
Spoke with patient and relayed message from doctor. Agreeable to discontinue trazodone 50mg  and picking up clonazepam 0.5mg  qhs at pharmacy. Thank you.

## 2019-01-19 ENCOUNTER — Other Ambulatory Visit: Payer: Self-pay | Admitting: Obstetrics and Gynecology

## 2019-01-19 DIAGNOSIS — R928 Other abnormal and inconclusive findings on diagnostic imaging of breast: Secondary | ICD-10-CM

## 2019-01-23 ENCOUNTER — Ambulatory Visit (HOSPITAL_COMMUNITY): Payer: 59 | Admitting: Psychiatry

## 2019-01-26 ENCOUNTER — Ambulatory Visit: Payer: Self-pay

## 2019-01-26 ENCOUNTER — Other Ambulatory Visit: Payer: Self-pay

## 2019-01-26 ENCOUNTER — Ambulatory Visit
Admission: RE | Admit: 2019-01-26 | Discharge: 2019-01-26 | Disposition: A | Discharge status: Home / Self Care | Payer: 59 | Source: Ambulatory Visit | Attending: Obstetrics and Gynecology | Admitting: Obstetrics and Gynecology

## 2019-01-26 DIAGNOSIS — R928 Other abnormal and inconclusive findings on diagnostic imaging of breast: Secondary | ICD-10-CM

## 2019-02-04 ENCOUNTER — Other Ambulatory Visit (HOSPITAL_COMMUNITY): Payer: Self-pay | Admitting: Psychiatry

## 2019-02-04 DIAGNOSIS — F419 Anxiety disorder, unspecified: Secondary | ICD-10-CM

## 2019-02-04 DIAGNOSIS — F331 Major depressive disorder, recurrent, moderate: Secondary | ICD-10-CM

## 2019-02-23 ENCOUNTER — Other Ambulatory Visit: Payer: Self-pay

## 2019-02-23 ENCOUNTER — Encounter (HOSPITAL_COMMUNITY): Payer: Self-pay | Admitting: Psychiatry

## 2019-02-23 ENCOUNTER — Ambulatory Visit (INDEPENDENT_AMBULATORY_CARE_PROVIDER_SITE_OTHER): Payer: 59 | Admitting: Psychiatry

## 2019-02-23 DIAGNOSIS — F331 Major depressive disorder, recurrent, moderate: Secondary | ICD-10-CM

## 2019-02-23 DIAGNOSIS — F419 Anxiety disorder, unspecified: Secondary | ICD-10-CM | POA: Diagnosis not present

## 2019-02-23 MED ORDER — VENLAFAXINE HCL ER 150 MG PO CP24: 300.0000 mg | ORAL_CAPSULE | Freq: Every day | ORAL | 0 refills | Status: DC

## 2019-02-23 MED ORDER — LAMOTRIGINE 150 MG PO TABS: 150.0000 mg | ORAL_TABLET | Freq: Every day | ORAL | 0 refills | Status: DC

## 2019-02-23 MED ORDER — DULOXETINE HCL 30 MG PO CPEP: 30.0000 mg | ORAL_CAPSULE | Freq: Every day | ORAL | 0 refills | Status: DC

## 2019-02-23 MED ORDER — CLONAZEPAM 0.5 MG PO TABS: 0.5000 mg | ORAL_TABLET | Freq: Every day | ORAL | 2 refills | Status: DC

## 2019-02-23 NOTE — Progress Notes (Signed)
Virtual Visit via Telephone Note  I connected with Kayla [name] on 02/23/19 at  3:40 PM EDT by telephone and verified that I am speaking with the correct person using two identifiers.   I discussed the limitations, risks, security and privacy concerns of performing an evaluation and management service by telephone and the availability of in person appointments. I also discussed with the patient that there may be a patient responsible charge related to this service. The patient expressed understanding and agreed to proceed.   History of Present Illness: Patient was evaluated by phone session.  We tried her on trazodone but she started to feel very foggy, having memory problems and forgetfulness.  She went back to Klonopin and she is feeling better.  She is sleeping at least 6 hours.  Overall she describes her mood is good.  She denies any crying spells or any feeling of hopelessness or worthlessness.  She takes Effexor, Cymbalta and Lamictal and reported no tremors, shakes, rash or any itching.  She visited frequently beach and today she is at the beach with her husband.  She wants to continue her current medication.  She denies any major panic attack.  She denies any crying spells.  Her energy level is good.  Her appetite is okay.  She reported her weight is a stable.  She is having upcoming blood work in August with her primary care physician.  Patient denies drinking or using any illegal substances.   Past Psychiatric History:Reviewed. H/Opsychiatric hospitalization in 2007because ofdepressionand panic attack. No history of suicidal attempt, mania, psychosis. In the past tried Zoloft, Remeron, Wellbutrin, Prozac, Ambien CR,Abilify, Xanax, Belsomra, Risperdal and trazodone.   Risperdal.   Psychiatric Specialty Exam: Physical Exam  ROS  There were no vitals taken for this visit.There is no height or weight on file to calculate BMI.  General Appearance: NA  Eye Contact:  NA  Speech:   Clear and Coherent  Volume:  Normal  Mood:  Euthymic  Affect:  NA  Thought Process:  Goal Directed  Orientation:  Full (Time, Place, and Person)  Thought Content:  WDL and Logical  Suicidal Thoughts:  No  Homicidal Thoughts:  No  Memory:  Immediate;   Good Recent;   Good Remote;   Good  Judgement:  Good  Insight:  Good  Psychomotor Activity:  NA  Concentration:  Concentration: Good and Attention Span: Good  Recall:  Good  Fund of Knowledge:  Good  Language:  Good  Akathisia:  No  Handed:  Right  AIMS (if indicated):     Assets:  Communication Skills Desire for Improvement Housing Resilience Social Support Transportation  ADL's:  Intact  Cognition:  WNL  Sleep:   6 hrs      Assessment and Plan: Major depressive disorder, recurrent.  Anxiety.  Discontinue trazodone as patient having side effect specially memory impairment and forgetfulness with the trazodone.  She was restarted on Klonopin 0.5 mg which is working very well but she still have some time sleep issue after 6 hours.  I recommend she can try over-the-counter low-dose melatonin however reminded that it can also cause sedation and excessive sleep.  She understand and agree that she would be cautious.  She like to continue her other medication which is working very well.  I will continue Effexor 300 mg in the morning, Lamictal 150 mg daily and Cymbalta 30 mg daily.  Discussed medication side effects and benefits.  She does not feel that she need to  see a therapist.  I recommend to call us back if she has any question or any concern.  Follow-up in 3 months.  Follow Up Instructions:    I discussed the assessment and treatment plan with the patient. The patient was provided an opportunity to ask questions and all were answered. The patient agreed with the plan and demonstrated an understanding of the instructions.   The patient was advised to call back or seek an in-person evaluation if the symptoms worsen or if the  condition fails to improve as anticipated.  I provided 20 minutes of non-face-to-face time during this encounter.   Kathlee Nations, MD

## 2019-02-27 ENCOUNTER — Telehealth (HOSPITAL_COMMUNITY): Payer: Self-pay

## 2019-02-27 ENCOUNTER — Other Ambulatory Visit (HOSPITAL_COMMUNITY): Payer: Self-pay

## 2019-02-27 DIAGNOSIS — F331 Major depressive disorder, recurrent, moderate: Secondary | ICD-10-CM

## 2019-02-27 DIAGNOSIS — F419 Anxiety disorder, unspecified: Secondary | ICD-10-CM

## 2019-02-27 MED ORDER — LAMOTRIGINE 150 MG PO TABS: 150.0000 mg | ORAL_TABLET | Freq: Every day | ORAL | 0 refills | Status: DC

## 2019-02-27 MED ORDER — DULOXETINE HCL 30 MG PO CPEP: 30.0000 mg | ORAL_CAPSULE | Freq: Every day | ORAL | 0 refills | Status: DC

## 2019-02-27 MED ORDER — VENLAFAXINE HCL ER 150 MG PO CP24: 300.0000 mg | ORAL_CAPSULE | Freq: Every day | ORAL | 0 refills | Status: DC

## 2019-02-27 NOTE — Telephone Encounter (Signed)
done

## 2019-02-27 NOTE — Telephone Encounter (Signed)
Please resend patients medications to [facility], this is where she gets all maintenance medications, thank you

## 2019-05-13 ENCOUNTER — Other Ambulatory Visit (HOSPITAL_COMMUNITY): Payer: Self-pay | Admitting: Psychiatry

## 2019-05-13 DIAGNOSIS — F331 Major depressive disorder, recurrent, moderate: Secondary | ICD-10-CM

## 2019-05-13 DIAGNOSIS — F419 Anxiety disorder, unspecified: Secondary | ICD-10-CM

## 2019-05-22 ENCOUNTER — Ambulatory Visit (INDEPENDENT_AMBULATORY_CARE_PROVIDER_SITE_OTHER): Payer: 59 | Admitting: Psychiatry

## 2019-05-22 ENCOUNTER — Other Ambulatory Visit: Payer: Self-pay

## 2019-05-22 ENCOUNTER — Encounter (HOSPITAL_COMMUNITY): Payer: Self-pay | Admitting: Psychiatry

## 2019-05-22 DIAGNOSIS — F419 Anxiety disorder, unspecified: Secondary | ICD-10-CM

## 2019-05-22 DIAGNOSIS — F331 Major depressive disorder, recurrent, moderate: Secondary | ICD-10-CM

## 2019-05-22 MED ORDER — VENLAFAXINE HCL ER 150 MG PO CP24: 300.0000 mg | ORAL_CAPSULE | Freq: Every day | ORAL | 0 refills | Status: DC

## 2019-05-22 MED ORDER — CLONAZEPAM 0.5 MG PO TABS: 0.5000 mg | ORAL_TABLET | Freq: Every day | ORAL | 0 refills | Status: DC

## 2019-05-22 MED ORDER — LAMOTRIGINE 150 MG PO TABS: 150.0000 mg | ORAL_TABLET | Freq: Every day | ORAL | 0 refills | Status: DC

## 2019-05-22 MED ORDER — DULOXETINE HCL 30 MG PO CPEP: 30.0000 mg | ORAL_CAPSULE | Freq: Every day | ORAL | 0 refills | Status: DC

## 2019-05-22 NOTE — Progress Notes (Signed)
Virtual Visit via Telephone Note  I connected with Kayla [name] on 05/22/19 at  3:40 PM EDT by telephone and verified that I am speaking with the correct person using two identifiers.   I discussed the limitations, risks, security and privacy concerns of performing an evaluation and management service by telephone and the availability of in person appointments. I also discussed with the patient that there may be a patient responsible charge related to this service. The patient expressed understanding and agreed to proceed.   History of Present Illness: Patient was evaluated by phone session.  She is back on Klonopin is helping her sleep 5 to 6 hours.  We have tried trazodone but she did not like the side effects and having forgetfulness and memory problem.  She is concerned about her husband's sister who is in [place] and fell and having a lot of health issues.  Due to Covid she was not getting any in-house therapy.  Overall she described her current medicine is working.  She denies any irritability, crying spells, feeling of hopelessness or worthlessness.  She denies any major panic attack.  Currently level is good.  She started taking melatonin which helps but she is only taking 1 mg.  She denies drinking or using any illegal substances.  Past Psychiatric History:Reviewed. H/Opsychiatric hospitalization in 2007because ofdepressionand panic attack. No history of suicidal attempt, mania, psychosis. In the past tried Zoloft, Remeron, Wellbutrin, Prozac, Ambien CR,Abilify, Xanax, Belsomra, Risperdal and trazodone.   Risperdal.    Psychiatric Specialty Exam: Physical Exam  ROS  There were no vitals taken for this visit.There is no height or weight on file to calculate BMI.  General Appearance: NA  Eye Contact:  NA  Speech:  Clear and Coherent and Normal Rate  Volume:  Normal  Mood:  Euthymic  Affect:  NA  Thought Process:  Goal Directed  Orientation:  Full (Time, Place, and  Person)  Thought Content:  WDL and Logical  Suicidal Thoughts:  No  Homicidal Thoughts:  No  Memory:  Immediate;   Good Recent;   Good Remote;   Good  Judgement:  Good  Insight:  Good  Psychomotor Activity:  NA  Concentration:  Concentration: Good and Attention Span: Good  Recall:  Good  Fund of Knowledge:  Good  Language:  Good  Akathisia:  No  Handed:  Right  AIMS (if indicated):     Assets:  Communication Skills Housing Resilience Social Support  ADL's:  Intact  Cognition:  WNL  Sleep:    5-6 hrs      Assessment and Plan: Major depressive disorder, recurrent.  Anxiety.  Recommended to try melatonin up to 3 mg to help with insomnia.  Continue Klonopin 0.5 mg as patient tried trazodone with poor outcome.  She does not want to change medication.  She is not interested in therapy.  I will continue Lamictal 150 mg daily, Cymbalta 30 mg daily and Effexor 300 mg in the morning.  She has no rash, itching tremors or shakes.  She wanted [facility]  Recommended to call us back if she has any question, concern or if she feels worsening of the symptoms.  Follow-up in 3 months.  Follow Up Instructions:    I discussed the assessment and treatment plan with the patient. The patient was provided an opportunity to ask questions and all were answered. The patient agreed with the plan and demonstrated an understanding of the instructions.  The patient was advised to call back or seek an in-person evaluation if the symptoms worsen or if the condition fails to improve as anticipated.  I provided 20 minutes of non-face-to-face time during this encounter.   Kathlee Nations, MD

## 2019-08-02 ENCOUNTER — Other Ambulatory Visit (HOSPITAL_COMMUNITY): Payer: Self-pay | Admitting: Psychiatry

## 2019-08-02 DIAGNOSIS — F331 Major depressive disorder, recurrent, moderate: Secondary | ICD-10-CM

## 2019-08-02 DIAGNOSIS — F419 Anxiety disorder, unspecified: Secondary | ICD-10-CM

## 2019-08-18 ENCOUNTER — Other Ambulatory Visit (HOSPITAL_COMMUNITY): Payer: Self-pay | Admitting: Psychiatry

## 2019-08-18 DIAGNOSIS — F331 Major depressive disorder, recurrent, moderate: Secondary | ICD-10-CM

## 2019-08-18 DIAGNOSIS — F419 Anxiety disorder, unspecified: Secondary | ICD-10-CM

## 2019-08-22 ENCOUNTER — Ambulatory Visit (INDEPENDENT_AMBULATORY_CARE_PROVIDER_SITE_OTHER): Payer: 59 | Admitting: Psychiatry

## 2019-08-22 ENCOUNTER — Other Ambulatory Visit: Payer: Self-pay

## 2019-08-22 ENCOUNTER — Encounter (HOSPITAL_COMMUNITY): Payer: Self-pay | Admitting: Psychiatry

## 2019-08-22 DIAGNOSIS — F5101 Primary insomnia: Secondary | ICD-10-CM

## 2019-08-22 DIAGNOSIS — F331 Major depressive disorder, recurrent, moderate: Secondary | ICD-10-CM | POA: Diagnosis not present

## 2019-08-22 DIAGNOSIS — F419 Anxiety disorder, unspecified: Secondary | ICD-10-CM | POA: Diagnosis not present

## 2019-08-22 MED ORDER — TEMAZEPAM 15 MG PO CAPS: 15.0000 mg | ORAL_CAPSULE | Freq: Every day | ORAL | 1 refills | Status: DC

## 2019-08-22 MED ORDER — LAMOTRIGINE 150 MG PO TABS: 150.0000 mg | ORAL_TABLET | Freq: Every day | ORAL | 0 refills | Status: DC

## 2019-08-22 MED ORDER — DULOXETINE HCL 30 MG PO CPEP: 30.0000 mg | ORAL_CAPSULE | Freq: Every day | ORAL | 0 refills | Status: DC

## 2019-08-22 MED ORDER — VENLAFAXINE HCL ER 150 MG PO CP24: 300.0000 mg | ORAL_CAPSULE | Freq: Every day | ORAL | 0 refills | Status: DC

## 2019-08-22 NOTE — Progress Notes (Signed)
Virtual Visit via Telephone Note  I connected with Kayla [name] on 08/22/19 at  3:40 PM EST by telephone and verified that I am speaking with the correct person using two identifiers.   I discussed the limitations, risks, security and privacy concerns of performing an evaluation and management service by telephone and the availability of in person appointments. I also discussed with the patient that there may be a patient responsible charge related to this service. The patient expressed understanding and agreed to proceed.   History of Present Illness: Patient was evaluated by phone session.  She is still struggling with Klonopin despite taking melatonin up to 3 mg.  She feels her anxiety depression is much better but continued to have frustration with insomnia.  She is able to fall asleep but usually wake up 4:00 in the morning.  She endorses dreams about her past but denies any night terrors, flashback or any depression.  Patient now moved [place] close to [place] and Christmas was busy because she was moving.  She was able to sell her house very quickly.  She feels no other issues and she denies any crying spells or any feeling of hopelessness or worthlessness.  She denies any major panic attack.  Her energy level is good.  Her appetite is okay.  She like to try temazepam which she took few times in the past and that worked very well.  She is afraid to take higher dose of Klonopin.  She is no longer taking trazodone.  We have tried trazodone, Ambien, Remeron with poor outcome.  Past Psychiatric History:Reviewed. H/Opsychiatric hospitalization in 2007because ofdepressionand panic attack. No history of suicidal attempt, mania, psychosis. In the past tried Zoloft, Remeron, Wellbutrin, Prozac, Ambien CR,Abilify, Xanax, Belsomra,Risperdal and trazodone.   Psychiatric Specialty Exam: Physical Exam  Review of Systems  There were no vitals taken for this visit.There is no height  or weight on file to calculate BMI.  General Appearance: NA  Eye Contact:  NA  Speech:  Clear and Coherent and Normal Rate  Volume:  Normal  Mood:  Euthymic  Affect:  NA  Thought Process:  Goal Directed  Orientation:  Full (Time, Place, and Person)  Thought Content:  WDL  Suicidal Thoughts:  No  Homicidal Thoughts:  No  Memory:  Immediate;   Good Recent;   Good Remote;   Good  Judgement:  Intact  Insight:  Present  Psychomotor Activity:  NA  Concentration:  Concentration: Good and Attention Span: Good  Recall:  Good  Fund of Knowledge:  Good  Language:  Good  Akathisia:  No  Handed:  Right  AIMS (if indicated):     Assets:  Communication Skills Desire for Improvement Housing Resilience Social Support  ADL's:  Intact  Cognition:  WNL  Sleep:   5-6 hrs      Assessment and Plan: Major depressive disorder, recurrent.  Anxiety.  Primary insomnia.  I discussed her dreams could be related to Effexor but patient reported that she has been taking Effexor for a long time and it is working very well and does not want to change to Effexor.  She is willing to try temazepam which she tried in the past with good outcome.  I discussed that she cannot take Klonopin and temazepam together.  She agreed to discontinue Klonopin since she remembered temazepam help.  We discussed benzodiazepine dependence tolerance of the drug.  She does not want to change other medication since they are helping very well.  I will continue Cymbalta 30 mg daily, Effexor 3 mg in the morning and Lamictal 150 mg daily.  She has no rash, itching tremors or shakes.  Patient like to keep her doctor's appointment and [facility] and she is willing to come in in person if needed in the future.  We will discontinue Klonopin and try temazepam 15 mg at bedtime.  We will provide a 30-day supply to her local pharmacy in [place] and if she feels it is working then she will call to get 90-day supply from [facility].   Recommended to call us back if she is any question of any concern.  Follow-up in 3 months.  Follow Up Instructions:    I discussed the assessment and treatment plan with the patient. The patient was provided an opportunity to ask questions and all were answered. The patient agreed with the plan and demonstrated an understanding of the instructions.   The patient was advised to call back or seek an in-person evaluation if the symptoms worsen or if the condition fails to improve as anticipated.  I provided 20 minutes of non-face-to-face time during this encounter.   Kathlee Nations, MD

## 2019-09-18 ENCOUNTER — Telehealth (HOSPITAL_COMMUNITY): Payer: Self-pay | Admitting: *Deleted

## 2019-09-18 ENCOUNTER — Other Ambulatory Visit (HOSPITAL_COMMUNITY): Payer: Self-pay | Admitting: Psychiatry

## 2019-09-18 DIAGNOSIS — F5101 Primary insomnia: Secondary | ICD-10-CM

## 2019-09-18 NOTE — Telephone Encounter (Signed)
Pt called stating that her pharmacy [facility] id requiring a 90 day supply of the Temazepam 15mg  qhs. Please review. Upcoming appointment 11/21/19.

## 2019-09-18 NOTE — Telephone Encounter (Signed)
This is Dr. Adele Schilder' s patient.

## 2019-09-19 MED ORDER — TEMAZEPAM 15 MG PO CAPS: 15.0000 mg | ORAL_CAPSULE | Freq: Every day | ORAL | 0 refills | Status: DC

## 2019-09-19 NOTE — Telephone Encounter (Signed)
Done

## 2019-09-21 ENCOUNTER — Telehealth (HOSPITAL_COMMUNITY): Payer: Self-pay

## 2019-09-21 NOTE — Telephone Encounter (Signed)
Medication refill - Telephone call with patient after she left a message regarding her need for a 90 day order of Temazepam for [facility]. Informed Dr. Adele Schilder had sent this in on 09/19/19 so it should be on it's way in the mail.  Patient to call back if any problems getting refill.

## 2019-10-02 ENCOUNTER — Other Ambulatory Visit (HOSPITAL_COMMUNITY): Payer: Self-pay | Admitting: Psychiatry

## 2019-10-02 DIAGNOSIS — F331 Major depressive disorder, recurrent, moderate: Secondary | ICD-10-CM

## 2019-10-02 DIAGNOSIS — F419 Anxiety disorder, unspecified: Secondary | ICD-10-CM

## 2019-10-12 ENCOUNTER — Other Ambulatory Visit (HOSPITAL_COMMUNITY): Payer: Self-pay | Admitting: Psychiatry

## 2019-10-12 DIAGNOSIS — F331 Major depressive disorder, recurrent, moderate: Secondary | ICD-10-CM

## 2019-10-12 DIAGNOSIS — F419 Anxiety disorder, unspecified: Secondary | ICD-10-CM

## 2019-10-28 ENCOUNTER — Other Ambulatory Visit (HOSPITAL_COMMUNITY): Payer: Self-pay | Admitting: Psychiatry

## 2019-10-28 DIAGNOSIS — F419 Anxiety disorder, unspecified: Secondary | ICD-10-CM

## 2019-10-28 DIAGNOSIS — F331 Major depressive disorder, recurrent, moderate: Secondary | ICD-10-CM

## 2019-11-21 ENCOUNTER — Other Ambulatory Visit: Payer: Self-pay

## 2019-11-21 ENCOUNTER — Telehealth (INDEPENDENT_AMBULATORY_CARE_PROVIDER_SITE_OTHER): Payer: 59 | Admitting: Psychiatry

## 2019-11-21 ENCOUNTER — Encounter (HOSPITAL_COMMUNITY): Payer: Self-pay | Admitting: Psychiatry

## 2019-11-21 VITALS — Wt 122.0 lb

## 2019-11-21 DIAGNOSIS — F331 Major depressive disorder, recurrent, moderate: Secondary | ICD-10-CM

## 2019-11-21 DIAGNOSIS — F419 Anxiety disorder, unspecified: Secondary | ICD-10-CM | POA: Diagnosis not present

## 2019-11-21 DIAGNOSIS — F5101 Primary insomnia: Secondary | ICD-10-CM

## 2019-11-21 MED ORDER — TEMAZEPAM 15 MG PO CAPS: 15.0000 mg | ORAL_CAPSULE | Freq: Every day | ORAL | 1 refills | Status: DC

## 2019-11-21 MED ORDER — LAMOTRIGINE 150 MG PO TABS: 150.0000 mg | ORAL_TABLET | Freq: Every day | ORAL | 1 refills | Status: DC

## 2019-11-21 MED ORDER — VENLAFAXINE HCL ER 150 MG PO CP24: 300.0000 mg | ORAL_CAPSULE | Freq: Every day | ORAL | 1 refills | Status: DC

## 2019-11-21 MED ORDER — DULOXETINE HCL 30 MG PO CPEP: 30.0000 mg | ORAL_CAPSULE | Freq: Every day | ORAL | 1 refills | Status: DC

## 2019-11-21 NOTE — Progress Notes (Signed)
Virtual Visit via Telephone Note  I connected with Kayla [name] on 11/21/19 at  4:00 PM EDT by telephone and verified that I am speaking with the correct person using two identifiers.   I discussed the limitations, risks, security and privacy concerns of performing an evaluation and management service by telephone and the availability of in person appointments. I also discussed with the patient that there may be a patient responsible charge related to this service. The patient expressed understanding and agreed to proceed.   History of Present Illness: Patient was evaluated by phone session.  She is taking temazepam which is helping but at least 6 hours sleep.  She tried other medication but so far she felt temazepam had helped her the most.  Overall she described her mood is stable.  She denies any panic attack or any nervousness.  [place]  Her husband is retired.  Patient denies any crying spells or any feeling of hopelessness or worthlessness.  Her energy level is good.  Her appetite is okay.  She has no tremors, shakes or any EPS.  Recently she had a colonoscopy and she is scheduled to have annual physical visit with PCP in September.  Her physician is at [facility].    Past Psychiatric History:Reviewed. H/Oinpatient in 2040for depressionand panic attack. No h/o suicidal attempt, mania, psychosis. Tried Zoloft, Remeron, Wellbutrin, Prozac, Ambien CR,Abilify, Xanax, Belsomra,Risperdal and trazodone.   Psychiatric Specialty Exam: Physical Exam  Review of Systems  Weight 122 lb (55.3 kg).There is no height or weight on file to calculate BMI.  General Appearance: NA  Eye Contact:  NA  Speech:  Clear and Coherent and Normal Rate  Volume:  Normal  Mood:  Euthymic  Affect:  NA  Thought Process:  Goal Directed  Orientation:  Full (Time, Place, and Person)  Thought Content:  Logical  Suicidal Thoughts:  No  Homicidal  Thoughts:  No  Memory:  Immediate;   Good Recent;   Good Remote;   Good  Judgement:  Good  Insight:  Good  Psychomotor Activity:  NA  Concentration:  Concentration: Good and Attention Span: Good  Recall:  Good  Fund of Knowledge:  Good  Language:  Good  Akathisia:  No  Handed:  Right  AIMS (if indicated):     Assets:  Communication Skills Desire for Improvement Housing Resilience Social Support Transportation  ADL's:  Intact  Cognition:  WNL  Sleep:   6 hrs      Assessment and Plan: Major depressive disorder, recurrent.  Anxiety.  Primary insomnia.  Patient is a stable on her current medication.  I will continue Effexor 300 mg in the morning, Lamictal 150 mg daily, temazepam 15 mg at bedtime and Cymbalta 30 mg daily.  Discussed medication side effects and benefits.  Patient is scheduled to have annual physical and blood work in September at [facility].  I recommend to have her blood work results faxed to [name].  Follow-up in 6 months.    Follow Up Instructions:    I discussed the assessment and treatment plan with the patient. The patient was provided an opportunity to ask questions and all were answered. The patient agreed with the plan and demonstrated an understanding of the instructions.   The patient was advised to call back or seek an in-person evaluation if the symptoms worsen or if the condition fails to improve as anticipated.  I provided 20 minutes of non-face-to-face time during  this encounter.   Kathlee Nations, MD

## 2020-01-30 IMAGING — CT CT CHEST LUNG CANCER SCREENING LOW DOSE W/O CM
1 of 3 series · 10 of 40 positions shown, 13 images · non-contrast
Comparison: None.

CLINICAL DATA: 61-year-old female with 30 pack-year history of
smoking. Lung cancer screening.

EXAM:
CT CHEST WITHOUT CONTRAST LOW-DOSE FOR LUNG CANCER SCREENING
TECHNIQUE: Multidetector CT imaging of the chest was performed following the
standard protocol without IV contrast.

[ct lung segmentation data · axial · 0.71mm/px · z∈[-300,-300]mm · 10 of 332 frames shown]
[frame 1/332  mediastinal]
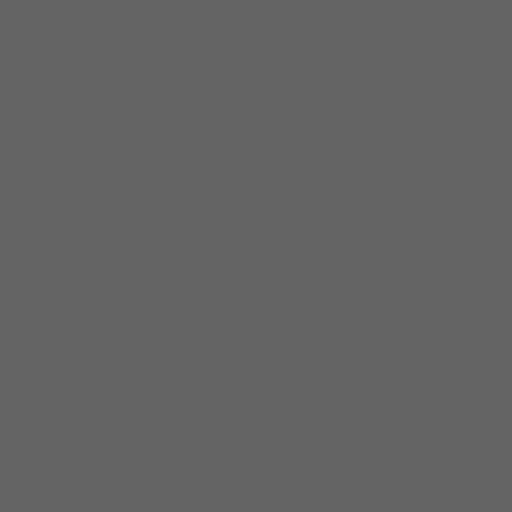
[frame 1/332  lung]
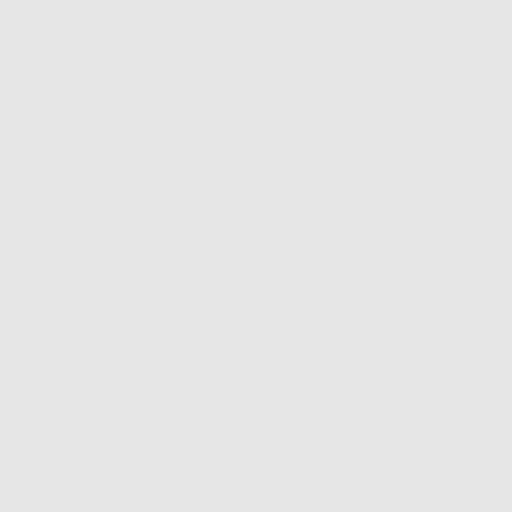
[frame 37/332  lung]
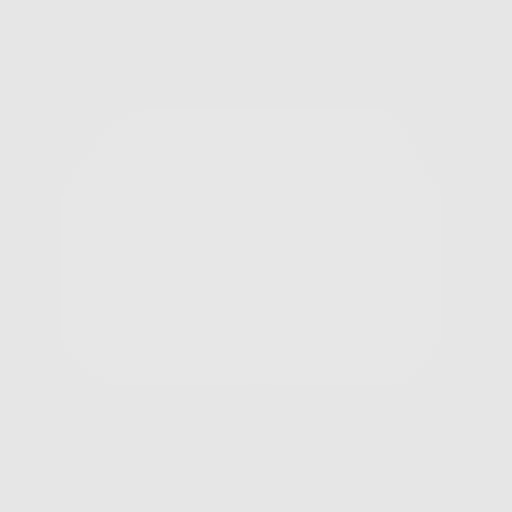
[frame 74/332  lung]
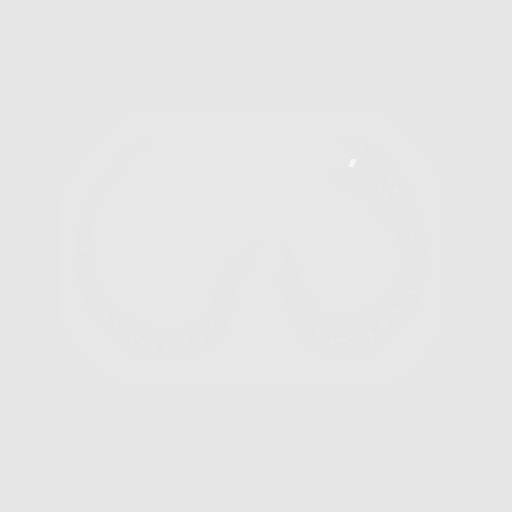
[frame 111/332  lung]
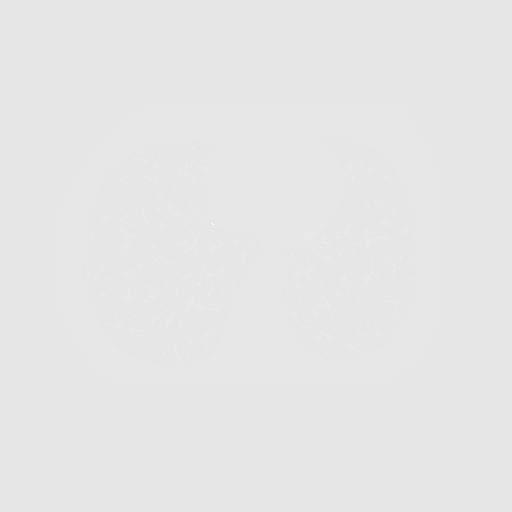
[frame 148/332  mediastinal]
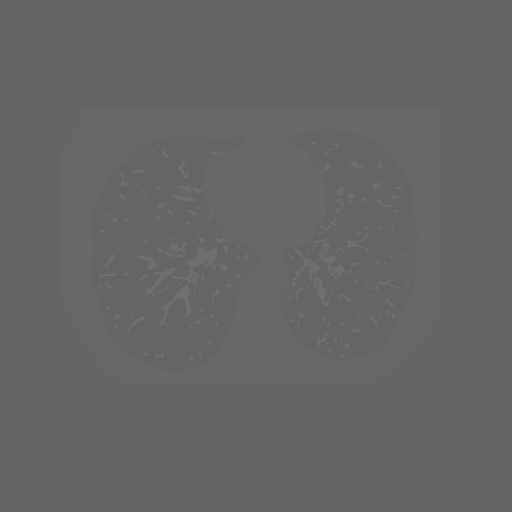
[frame 148/332  lung]
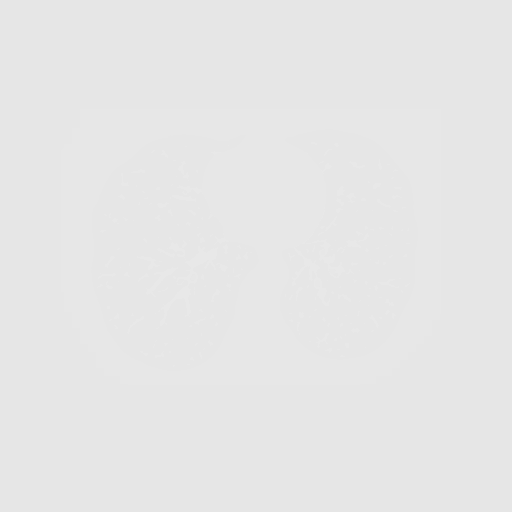
[frame 184/332  lung]
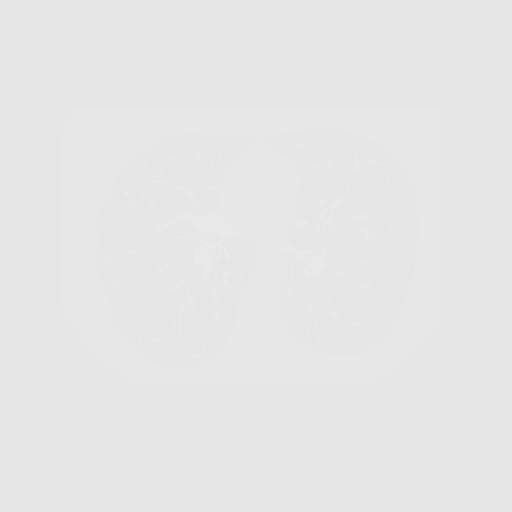
[frame 221/332  lung]
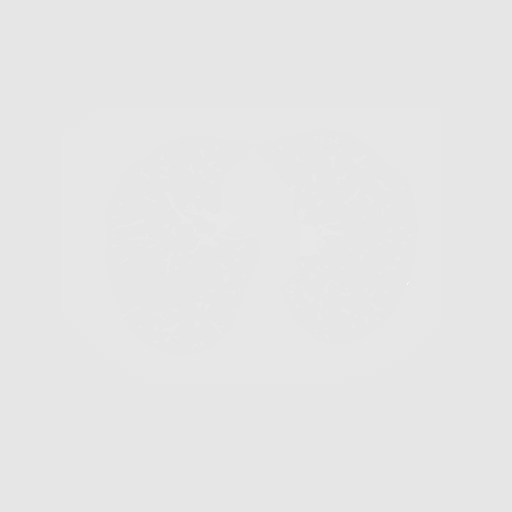
[frame 258/332  lung]
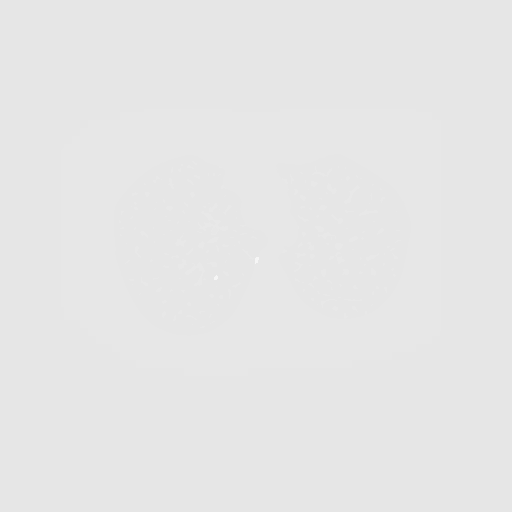
[frame 295/332  mediastinal]
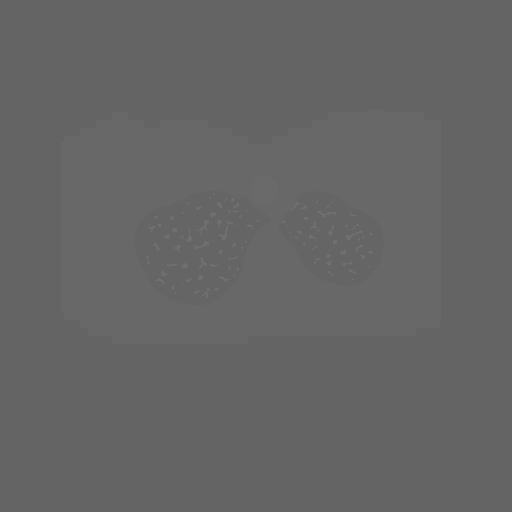
[frame 295/332  lung]
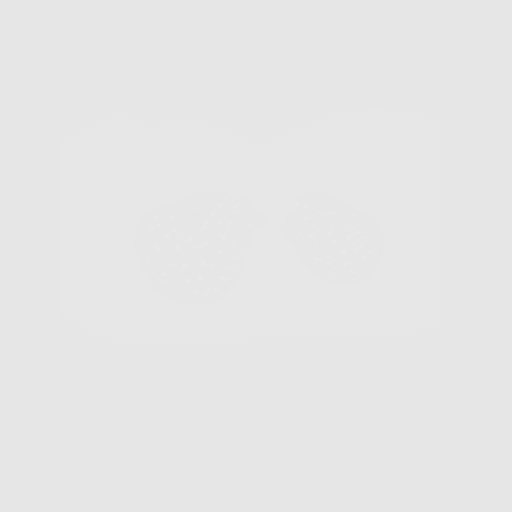
[frame 332/332  lung]
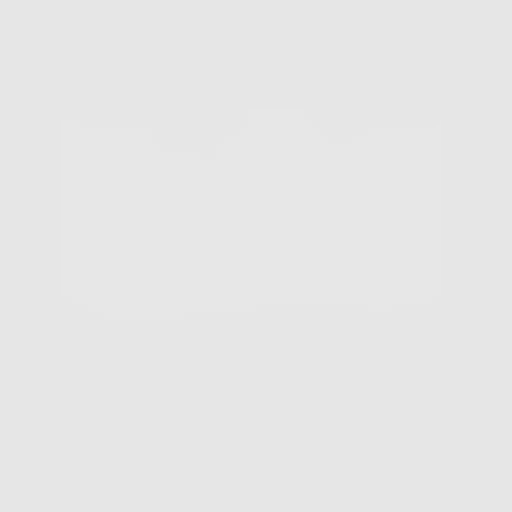

[10 of 40 positions shown; findings below may reference images not displayed]

FINDINGS: Cardiovascular: The heart size is normal. No pericardial effusion.
No thoracic aortic aneurysm.

Mediastinum/Nodes: No mediastinal lymphadenopathy. There is no hilar
lymphadenopathy. The esophagus has normal imaging features. There is
no axillary lymphadenopathy.

Lungs/Pleura: Biapical pleuroparenchymal scarring is associated with
subtle changes of centrilobular emphysema. Scattered areas of small
airway impaction are evident calcified granuloma is noted in the
right middle and lower lobes. Noncalcified left upper lobe pulmonary
nodule measures 2 mm in volume derived equivalent diameter. No
overtly suspicious pulmonary nodule or mass. No focal airspace
consolidation.

Upper Abdomen: Unremarkable.

Musculoskeletal: Unremarkable.
IMPRESSION: 1. Lung-RADS 2, benign appearance or behavior. Continue annual
screening with low-dose chest CT without contrast in 12 months.
2.  Emphysema. ([id number])

## 2020-04-05 ENCOUNTER — Other Ambulatory Visit (HOSPITAL_COMMUNITY): Payer: Self-pay | Admitting: Psychiatry

## 2020-04-05 DIAGNOSIS — F419 Anxiety disorder, unspecified: Secondary | ICD-10-CM

## 2020-04-05 DIAGNOSIS — F331 Major depressive disorder, recurrent, moderate: Secondary | ICD-10-CM

## 2020-04-21 ENCOUNTER — Other Ambulatory Visit (HOSPITAL_COMMUNITY): Payer: Self-pay | Admitting: Psychiatry

## 2020-04-21 DIAGNOSIS — F419 Anxiety disorder, unspecified: Secondary | ICD-10-CM

## 2020-04-21 DIAGNOSIS — F331 Major depressive disorder, recurrent, moderate: Secondary | ICD-10-CM

## 2020-05-22 ENCOUNTER — Telehealth (INDEPENDENT_AMBULATORY_CARE_PROVIDER_SITE_OTHER): Payer: Medicare Other | Admitting: Psychiatry

## 2020-05-22 ENCOUNTER — Other Ambulatory Visit: Payer: Self-pay

## 2020-05-22 ENCOUNTER — Encounter (HOSPITAL_COMMUNITY): Payer: Self-pay | Admitting: Psychiatry

## 2020-05-22 DIAGNOSIS — F331 Major depressive disorder, recurrent, moderate: Secondary | ICD-10-CM | POA: Diagnosis not present

## 2020-05-22 DIAGNOSIS — F419 Anxiety disorder, unspecified: Secondary | ICD-10-CM

## 2020-05-22 DIAGNOSIS — F5101 Primary insomnia: Secondary | ICD-10-CM

## 2020-05-22 MED ORDER — DULOXETINE HCL 30 MG PO CPEP: 30.0000 mg | ORAL_CAPSULE | Freq: Every day | ORAL | 1 refills | Status: AC

## 2020-05-22 MED ORDER — VENLAFAXINE HCL ER 150 MG PO CP24: 300.0000 mg | ORAL_CAPSULE | Freq: Every day | ORAL | 1 refills | Status: AC

## 2020-05-22 MED ORDER — TEMAZEPAM 15 MG PO CAPS: 15.0000 mg | ORAL_CAPSULE | Freq: Every day | ORAL | 1 refills | Status: AC

## 2020-05-22 MED ORDER — LAMOTRIGINE 150 MG PO TABS: 150.0000 mg | ORAL_TABLET | Freq: Every day | ORAL | 1 refills | Status: AC

## 2020-05-22 NOTE — Progress Notes (Signed)
Virtual Visit via Telephone Note  I connected with Kayla [name] on 05/22/20 at  3:00 PM EDT by telephone and verified that I am speaking with the correct person using two identifiers.  Location: Patient: home Provider: home office   I discussed the limitations, risks, security and privacy concerns of performing an evaluation and management service by telephone and the availability of in person appointments. I also discussed with the patient that there may be a patient responsible charge related to this service. The patient expressed understanding and agreed to proceed.   History of Present Illness: Patient is evaluated by phone session.  She enjoys living in [place] near [employer].  This is her favorite weather as not that many people at the beach.  She is sleeping better since taking temazepam.  She feels her current medicine is working and she denies any panic attack, crying spells or any feeling of hopelessness.  Her father-in-law died in 01/26/2023 he was 68 year old and had acute sickness which requires hospitalization and he died in few days.  Her husband did help his father while he was sick and now trying to take care of the remaining belongings of his father.  Patient overall doing well.  She does not want to change medication.  She has no tremors, shakes or any EPS.  She had a colonoscopy and annual physical with the primary care physician.  Patient told there was only one polyp which was removed.  She is pleased with her general health and no concerns from the medication.  She is not taking melatonin since temazepam helping her sleep.  Her appetite is okay.  Her weight is stable.  Past Psychiatric History:Reviewed. H/Oinpatient in 2059for depressionand panic attack. No h/o suicidal attempt, mania, psychosis. Tried Zoloft, Remeron, Wellbutrin, Prozac, Ambien CR,Abilify, Xanax, Belsomra,Risperdal and trazodone.   Psychiatric Specialty Exam: Physical Exam  Review of  Systems  Weight 125 lb (56.7 kg).There is no height or weight on file to calculate BMI.  General Appearance: NA  Eye Contact:  NA  Speech:  Clear and Coherent  Volume:  Normal  Mood:  Euthymic  Affect:  NA  Thought Process:  Goal Directed  Orientation:  Full (Time, Place, and Person)  Thought Content:  WDL  Suicidal Thoughts:  No  Homicidal Thoughts:  No  Memory:  Immediate;   Good Recent;   Good Remote;   Good  Judgement:  Good  Insight:  Good  Psychomotor Activity:  NA  Concentration:  Concentration: Good and Attention Span: Good  Recall:  Good  Fund of Knowledge:  Good  Language:  Good  Akathisia:  No  Handed:  Right  AIMS (if indicated):     Assets:  Communication Skills Desire for Improvement Housing Resilience Social Support Transportation  ADL's:  Intact  Cognition:  WNL  Sleep:   ok      Assessment and Plan: Major depressive disorder, recurrent.  Anxiety.  Primary insomnia.  Patient is a stable on her current medication.  She does not want to change medication.  I will continue Effexor 300 mg in the morning, Lamictal 150 in the morning, Cymbalta 30 mg daily and temazepam 15 mg at bedtime.  Discussed medication side effects and benefits.  Recommended to call us back if she has any question or any concern.  Patient's physician are in [place] area.  She preferred to keep her physician in [place].  Patient lives in [place] near the [place].  Follow-up in 6 months.  Follow Up Instructions:    I discussed the assessment and treatment plan with the patient. The patient was provided an opportunity to ask questions and all were answered. The patient agreed with the plan and demonstrated an understanding of the instructions.   The patient was advised to call back or seek an in-person evaluation if the symptoms worsen or if the condition fails to improve as anticipated.  I provided 15 minutes of non-face-to-face time during this encounter.   Kathlee Nations, MD

## 2020-10-07 ENCOUNTER — Other Ambulatory Visit (HOSPITAL_COMMUNITY): Payer: Self-pay | Admitting: Psychiatry

## 2020-10-07 DIAGNOSIS — F331 Major depressive disorder, recurrent, moderate: Secondary | ICD-10-CM

## 2020-10-07 DIAGNOSIS — F419 Anxiety disorder, unspecified: Secondary | ICD-10-CM

## 2020-10-25 ENCOUNTER — Other Ambulatory Visit (HOSPITAL_COMMUNITY): Payer: Self-pay | Admitting: Psychiatry

## 2020-10-25 DIAGNOSIS — F419 Anxiety disorder, unspecified: Secondary | ICD-10-CM

## 2020-10-25 DIAGNOSIS — F331 Major depressive disorder, recurrent, moderate: Secondary | ICD-10-CM

## 2020-11-18 ENCOUNTER — Telehealth (HOSPITAL_COMMUNITY): Payer: Medicare Other | Admitting: Psychiatry
